# Patient Record
Sex: Male | Born: 1955 | Race: White | Hispanic: No | Marital: Married | Smoking: Former smoker
Health system: Southern US, Community
[De-identification: ages and names within clinical notes are randomized; demographics above are authoritative.]

## PROBLEM LIST (undated history)

## (undated) DIAGNOSIS — E8881 Metabolic syndrome: Secondary | ICD-10-CM

## (undated) DIAGNOSIS — E119 Type 2 diabetes mellitus without complications: Secondary | ICD-10-CM

## (undated) DIAGNOSIS — I4891 Unspecified atrial fibrillation: Secondary | ICD-10-CM

## (undated) DIAGNOSIS — N200 Calculus of kidney: Secondary | ICD-10-CM

## (undated) DIAGNOSIS — I10 Essential (primary) hypertension: Secondary | ICD-10-CM

## (undated) DIAGNOSIS — I2510 Atherosclerotic heart disease of native coronary artery without angina pectoris: Secondary | ICD-10-CM

## (undated) DIAGNOSIS — Z8679 Personal history of other diseases of the circulatory system: Secondary | ICD-10-CM

## (undated) DIAGNOSIS — I9789 Other postprocedural complications and disorders of the circulatory system, not elsewhere classified: Secondary | ICD-10-CM

## (undated) DIAGNOSIS — Z951 Presence of aortocoronary bypass graft: Secondary | ICD-10-CM

## (undated) DIAGNOSIS — E785 Hyperlipidemia, unspecified: Secondary | ICD-10-CM

## (undated) HISTORY — DX: Other postprocedural complications and disorders of the circulatory system, not elsewhere classified: I97.89

## (undated) HISTORY — DX: Atherosclerotic heart disease of native coronary artery without angina pectoris: I25.10

## (undated) HISTORY — DX: Hyperlipidemia, unspecified: E78.5

## (undated) HISTORY — DX: Metabolic syndrome: E88.810

## (undated) HISTORY — DX: Unspecified atrial fibrillation: I48.91

## (undated) HISTORY — DX: Presence of aortocoronary bypass graft: Z95.1

## (undated) HISTORY — DX: Personal history of other diseases of the circulatory system: Z86.79

## (undated) HISTORY — DX: Calculus of kidney: N20.0

## (undated) HISTORY — DX: Type 2 diabetes mellitus without complications: E11.9

## (undated) HISTORY — DX: Metabolic syndrome: E88.81

---

## 1979-04-09 HISTORY — PX: STONE EXTRACTION WITH BASKET: SHX5318

## 1999-12-07 HISTORY — PX: CARDIOVASCULAR STRESS TEST: SHX262

## 2001-07-30 ENCOUNTER — Encounter: Admission: RE | Admit: 2001-07-30 | Discharge: 2001-07-30 | Payer: Self-pay | Admitting: Cardiology

## 2001-07-30 ENCOUNTER — Encounter: Payer: Self-pay | Admitting: Cardiology

## 2001-08-02 ENCOUNTER — Ambulatory Visit (HOSPITAL_COMMUNITY): Admission: RE | Admit: 2001-08-02 | Discharge: 2001-08-02 | Payer: Self-pay | Admitting: Cardiology

## 2001-08-02 HISTORY — PX: CARDIAC CATHETERIZATION: SHX172

## 2008-09-09 ENCOUNTER — Ambulatory Visit (HOSPITAL_COMMUNITY): Admission: RE | Admit: 2008-09-09 | Discharge: 2008-09-09 | Payer: Self-pay | Admitting: Cardiology

## 2010-11-23 LAB — COMPREHENSIVE METABOLIC PANEL
ALT: 29 U/L (ref 0–53)
Albumin: 3.7 g/dL (ref 3.5–5.2)
Alkaline Phosphatase: 79 U/L (ref 39–117)
Calcium: 8.9 mg/dL (ref 8.4–10.5)
Potassium: 4.1 mEq/L (ref 3.5–5.1)
Sodium: 135 mEq/L (ref 135–145)
Total Protein: 6.8 g/dL (ref 6.0–8.3)

## 2010-11-23 LAB — CBC
HCT: 52.8 % — ABNORMAL HIGH (ref 39.0–52.0)
Hemoglobin: 18.1 g/dL — ABNORMAL HIGH (ref 13.0–17.0)
MCHC: 34.4 g/dL (ref 30.0–36.0)
MCV: 85.1 fL (ref 78.0–100.0)
Platelets: 209 10*3/uL (ref 150–400)
RBC: 6.2 MIL/uL — ABNORMAL HIGH (ref 4.22–5.81)
RDW: 12.7 % (ref 11.5–15.5)
WBC: 9.4 10*3/uL (ref 4.0–10.5)

## 2010-11-23 LAB — URINALYSIS, MICROSCOPIC ONLY
Glucose, UA: >1000 mg/dL — AB
Hgb urine dipstick: NEGATIVE
Ketones, ur: 15 mg/dL — AB
Leukocytes, UA: NEGATIVE
Nitrite: NEGATIVE
Protein, ur: 30 mg/dL — AB
Specific Gravity, Urine: 1.033 — ABNORMAL HIGH (ref 1.005–1.030)
Urobilinogen, UA: 1 mg/dL (ref 0.0–1.0)
pH: 5.5 (ref 5.0–8.0)

## 2010-11-23 LAB — LIPID PANEL
HDL: 20 mg/dL — ABNORMAL LOW (ref >39)
LDL Cholesterol: 86 mg/dL (ref 0–99)
Triglycerides: 250 mg/dL — ABNORMAL HIGH (ref <150)

## 2010-11-23 LAB — HEMOGLOBIN A1C: Mean Plasma Glucose: 258 mg/dL

## 2010-11-23 LAB — PSA: PSA: 0.28 ng/mL (ref 0.10–4.00)

## 2010-12-24 NOTE — Cardiovascular Report (Signed)
[facility]  Patient:    Gregory Reese, Gregory Reese Visit Number: [id number] MRN: [id number]          Service Type: CAT Location: [facility] 2899 11 Attending Physician:  Loreli Dollar Dictated by:   Julieanne Manson, M.D. Admit Date:  08/02/2001                          Cardiac Catheterization  DATE OF BIRTH:  1956-06-30.  INDICATIONS:  [name] is a 55 year old male who is hypertensive.  He presented for a routine physical exam, had T wave inversion in the inferolateral leads which is new from his prior tracing.  He has had GI-type complaints and some different type of indigestion complaints.  Because of this, he is brought in for outpatient cardiac catheterization.  EQUIPMENT:  The 6-French Judkins configuration catheters.  DESCRIPTION OF PROCEDURE:  The patient was prepped and draped in the usual sterile fashion exposing the right groin.  Applying local anesthetic of 1% Xylocaine, the Seldinger technique was employed and a 6-French introducer sheath was placed in the right femoral artery.  Selective left and right coronary arteriography and ventriculography in the RAO projection was performed.  COMPLICATIONS:  None.  RESULTS:  Hemodynamic monitoring:  Central aortic pressure 143/79.  Left ventricular pressure 143/16 with no aortic valve gradient noted at the time of pullback.  Ventriculography:  Ventriculography in the RAO projection revealed normal left ventricular systolic function.  Ejection fraction was calculated at 76% but there was ventricular ectopy.  It was clearly greater than 60% with no wall motion abnormalities.  End diastolic pressure was 16.  Coronary arteriography:  No calcification seen on fluoroscopy. 1. Left main:  Normal. 2. LAD:  The LAD extended down to the apex of the heart and gave rise to a    very large first diagonal branch.  There were minimal irregularities in the    LAD. 3. Circumflex:  The circumflex gave  rise to one large OM-1 and two small    distal OMs.  This system was free of significant disease. 4. Right coronary artery:  The right coronary artery is a dominant vessel free    of disease. 5. Optional diagonal:  Minimal 40-50% ostial narrowing.  CONCLUSIONS: 1. Normal left ventricular systolic function. 2. Mild disease in the ostium of the optional diagonal.  No significant    disease in the left anterior descending artery, circumflex, or right    coronary artery.  At this point, the patient will be treated medically and will be discharged later today. Dictated by:   Julieanne Manson, M.D. Attending Physician:  Loreli Dollar DD:  08/02/01 TD:  08/02/01 Job: [id number]

## 2012-03-08 DIAGNOSIS — Z8679 Personal history of other diseases of the circulatory system: Secondary | ICD-10-CM

## 2012-03-08 DIAGNOSIS — I2510 Atherosclerotic heart disease of native coronary artery without angina pectoris: Secondary | ICD-10-CM

## 2012-03-08 HISTORY — DX: Atherosclerotic heart disease of native coronary artery without angina pectoris: I25.10

## 2012-03-08 HISTORY — DX: Personal history of other diseases of the circulatory system: Z86.79

## 2012-04-02 ENCOUNTER — Other Ambulatory Visit: Payer: Self-pay | Admitting: Cardiology

## 2012-04-03 ENCOUNTER — Other Ambulatory Visit: Payer: Self-pay | Admitting: Cardiology

## 2012-04-04 ENCOUNTER — Inpatient Hospital Stay (HOSPITAL_COMMUNITY)
Admission: RE | Admit: 2012-04-04 | Discharge: 2012-04-12 | DRG: 107 | Disposition: A | Discharge status: Home / Self Care | Payer: BC Managed Care – PPO | Source: Ambulatory Visit | Attending: Surgery | Admitting: Surgery

## 2012-04-04 ENCOUNTER — Encounter (HOSPITAL_COMMUNITY): Payer: Self-pay | Admitting: General Practice

## 2012-04-04 ENCOUNTER — Encounter (HOSPITAL_COMMUNITY)
Admission: RE | Disposition: A | Discharge status: Home / Self Care | Payer: Self-pay | Source: Ambulatory Visit | Attending: Surgery

## 2012-04-04 ENCOUNTER — Other Ambulatory Visit: Payer: Self-pay | Admitting: *Deleted

## 2012-04-04 DIAGNOSIS — Y838 Other surgical procedures as the cause of abnormal reaction of the patient, or of later complication, without mention of misadventure at the time of the procedure: Secondary | ICD-10-CM | POA: Diagnosis not present

## 2012-04-04 DIAGNOSIS — Z87891 Personal history of nicotine dependence: Secondary | ICD-10-CM

## 2012-04-04 DIAGNOSIS — K219 Gastro-esophageal reflux disease without esophagitis: Secondary | ICD-10-CM | POA: Diagnosis present

## 2012-04-04 DIAGNOSIS — Z9119 Patient's noncompliance with other medical treatment and regimen: Secondary | ICD-10-CM

## 2012-04-04 DIAGNOSIS — K5900 Constipation, unspecified: Secondary | ICD-10-CM | POA: Diagnosis present

## 2012-04-04 DIAGNOSIS — I519 Heart disease, unspecified: Secondary | ICD-10-CM | POA: Diagnosis not present

## 2012-04-04 DIAGNOSIS — I10 Essential (primary) hypertension: Secondary | ICD-10-CM | POA: Diagnosis present

## 2012-04-04 DIAGNOSIS — I4891 Unspecified atrial fibrillation: Secondary | ICD-10-CM | POA: Diagnosis present

## 2012-04-04 DIAGNOSIS — I2510 Atherosclerotic heart disease of native coronary artery without angina pectoris: Secondary | ICD-10-CM

## 2012-04-04 DIAGNOSIS — D62 Acute posthemorrhagic anemia: Secondary | ICD-10-CM | POA: Diagnosis not present

## 2012-04-04 DIAGNOSIS — I480 Paroxysmal atrial fibrillation: Secondary | ICD-10-CM | POA: Diagnosis not present

## 2012-04-04 DIAGNOSIS — Z7982 Long term (current) use of aspirin: Secondary | ICD-10-CM

## 2012-04-04 DIAGNOSIS — E119 Type 2 diabetes mellitus without complications: Secondary | ICD-10-CM | POA: Diagnosis present

## 2012-04-04 DIAGNOSIS — Z8249 Family history of ischemic heart disease and other diseases of the circulatory system: Secondary | ICD-10-CM

## 2012-04-04 DIAGNOSIS — E8779 Other fluid overload: Secondary | ICD-10-CM | POA: Diagnosis present

## 2012-04-04 DIAGNOSIS — R1310 Dysphagia, unspecified: Secondary | ICD-10-CM | POA: Diagnosis present

## 2012-04-04 DIAGNOSIS — I200 Unstable angina: Secondary | ICD-10-CM | POA: Diagnosis present

## 2012-04-04 DIAGNOSIS — E669 Obesity, unspecified: Secondary | ICD-10-CM | POA: Diagnosis present

## 2012-04-04 DIAGNOSIS — Z91199 Patient's noncompliance with other medical treatment and regimen due to unspecified reason: Secondary | ICD-10-CM

## 2012-04-04 DIAGNOSIS — Z79899 Other long term (current) drug therapy: Secondary | ICD-10-CM

## 2012-04-04 DIAGNOSIS — E1169 Type 2 diabetes mellitus with other specified complication: Secondary | ICD-10-CM | POA: Diagnosis present

## 2012-04-04 HISTORY — PX: LEFT HEART CATHETERIZATION WITH CORONARY ANGIOGRAM: SHX5451

## 2012-04-04 HISTORY — PX: CARDIAC CATHETERIZATION: SHX172

## 2012-04-04 HISTORY — DX: Essential (primary) hypertension: I10

## 2012-04-04 LAB — CBC
HCT: 41.9 % (ref 39.0–52.0)
Platelets: 203 10*3/uL (ref 150–400)
RBC: 5.1 MIL/uL (ref 4.22–5.81)
RDW: 13.1 % (ref 11.5–15.5)
WBC: 6 10*3/uL (ref 4.0–10.5)

## 2012-04-04 LAB — GLUCOSE, CAPILLARY

## 2012-04-04 LAB — LIPID PANEL
Cholesterol: 147 mg/dL (ref 0–200)
HDL: 20 mg/dL — ABNORMAL LOW (ref >39)
Total CHOL/HDL Ratio: 7.4 RATIO
Triglycerides: 285 mg/dL — ABNORMAL HIGH (ref <150)
VLDL: 57 mg/dL — ABNORMAL HIGH (ref 0–40)

## 2012-04-04 LAB — BASIC METABOLIC PANEL
Calcium: 8.9 mg/dL (ref 8.4–10.5)
Chloride: 104 mEq/L (ref 96–112)
Creatinine, Ser: 0.85 mg/dL (ref 0.50–1.35)
GFR calc Af Amer: >90 mL/min (ref >90)
Sodium: 138 mEq/L (ref 135–145)

## 2012-04-04 LAB — HEPATIC FUNCTION PANEL
Albumin: 3.7 g/dL (ref 3.5–5.2)
Alkaline Phosphatase: 54 U/L (ref 39–117)
Total Protein: 6.8 g/dL (ref 6.0–8.3)

## 2012-04-04 LAB — PROTIME-INR: INR: 1 (ref 0.00–1.49)

## 2012-04-04 LAB — HEMOGLOBIN A1C: Mean Plasma Glucose: 194 mg/dL — ABNORMAL HIGH (ref <117)

## 2012-04-04 SURGERY — LEFT HEART CATHETERIZATION WITH CORONARY ANGIOGRAM
Anesthesia: LOCAL

## 2012-04-04 MED ORDER — ASPIRIN EC 81 MG PO TBEC
81.0000 mg | DELAYED_RELEASE_TABLET | Freq: Every day | ORAL | Status: DC
  Administered 2012-04-05: 10:00:00 81 mg via ORAL
  Filled 2012-04-04 (×2): qty 1

## 2012-04-04 MED ORDER — ASPIRIN 81 MG PO TABS: 81.0000 mg | ORAL_TABLET | Freq: Every day | ORAL | Status: DC

## 2012-04-04 MED ORDER — ASPIRIN 81 MG PO CHEW
324.0000 mg | CHEWABLE_TABLET | ORAL | Status: AC
  Administered 2012-04-04: 324 mg via ORAL
  Filled 2012-04-04: qty 4

## 2012-04-04 MED ORDER — VERAPAMIL HCL 2.5 MG/ML IV SOLN
INTRAVENOUS | Status: AC
  Filled 2012-04-04: qty 2

## 2012-04-04 MED ORDER — SODIUM CHLORIDE 0.9 % IJ SOLN: 3.0000 mL | INTRAMUSCULAR | Status: DC | PRN

## 2012-04-04 MED ORDER — SODIUM CHLORIDE 0.9 % IJ SOLN
3.0000 mL | Freq: Two times a day (BID) | INTRAMUSCULAR | Status: DC
  Administered 2012-04-04 – 2012-04-05 (×3): 3 mL via INTRAVENOUS

## 2012-04-04 MED ORDER — SODIUM CHLORIDE 0.9 % IV SOLN: 250.0000 mL | INTRAVENOUS | Status: DC | PRN

## 2012-04-04 MED ORDER — DIAZEPAM 5 MG PO TABS
5.0000 mg | ORAL_TABLET | ORAL | Status: AC
  Administered 2012-04-04: 5 mg via ORAL
  Filled 2012-04-04: qty 1

## 2012-04-04 MED ORDER — NITROGLYCERIN 0.2 MG/ML ON CALL CATH LAB
INTRAVENOUS | Status: AC
  Filled 2012-04-04: qty 1

## 2012-04-04 MED ORDER — SODIUM CHLORIDE 0.9 % IV SOLN
INTRAVENOUS | Status: DC
  Administered 2012-04-04: 09:00:00 via INTRAVENOUS

## 2012-04-04 MED ORDER — HEPARIN (PORCINE) IN NACL 2-0.9 UNIT/ML-% IJ SOLN
INTRAMUSCULAR | Status: AC
  Filled 2012-04-04: qty 2000

## 2012-04-04 MED ORDER — ACETAMINOPHEN 325 MG PO TABS: 650.0000 mg | ORAL_TABLET | ORAL | Status: DC | PRN

## 2012-04-04 MED ORDER — LIDOCAINE HCL (PF) 1 % IJ SOLN
INTRAMUSCULAR | Status: AC
  Filled 2012-04-04: qty 30

## 2012-04-04 MED ORDER — FENTANYL CITRATE 0.05 MG/ML IJ SOLN
INTRAMUSCULAR | Status: AC
  Filled 2012-04-04: qty 2

## 2012-04-04 MED ORDER — HEPARIN SODIUM (PORCINE) 1000 UNIT/ML IJ SOLN
INTRAMUSCULAR | Status: AC
  Filled 2012-04-04: qty 1

## 2012-04-04 MED ORDER — ONDANSETRON HCL 4 MG/2ML IJ SOLN: 4.0000 mg | Freq: Four times a day (QID) | INTRAMUSCULAR | Status: DC | PRN

## 2012-04-04 MED ORDER — ATORVASTATIN CALCIUM 80 MG PO TABS
80.0000 mg | ORAL_TABLET | Freq: Every day | ORAL | Status: DC
  Administered 2012-04-04 – 2012-04-05 (×2): 80 mg via ORAL
  Filled 2012-04-04 (×3): qty 1

## 2012-04-04 MED ORDER — LISINOPRIL 40 MG PO TABS
40.0000 mg | ORAL_TABLET | Freq: Every day | ORAL | Status: DC
  Administered 2012-04-05: 40 mg via ORAL
  Filled 2012-04-04 (×3): qty 1

## 2012-04-04 MED ORDER — SODIUM CHLORIDE 0.9 % IV SOLN
INTRAVENOUS | Status: DC
  Administered 2012-04-04: 21:00:00 via INTRAVENOUS

## 2012-04-04 MED ORDER — LINAGLIPTIN 5 MG PO TABS
5.0000 mg | ORAL_TABLET | Freq: Every day | ORAL | Status: DC
  Administered 2012-04-05: 10:00:00 5 mg via ORAL
  Filled 2012-04-04 (×3): qty 1

## 2012-04-04 MED ORDER — PANTOPRAZOLE SODIUM 40 MG PO TBEC
40.0000 mg | DELAYED_RELEASE_TABLET | Freq: Every day | ORAL | Status: DC
  Administered 2012-04-04 – 2012-04-05 (×2): 40 mg via ORAL
  Filled 2012-04-04 (×2): qty 1

## 2012-04-04 MED ORDER — MORPHINE SULFATE 2 MG/ML IJ SOLN: 2.0000 mg | INTRAMUSCULAR | Status: DC | PRN

## 2012-04-04 MED ORDER — MIDAZOLAM HCL 2 MG/2ML IJ SOLN
INTRAMUSCULAR | Status: AC
  Filled 2012-04-04: qty 2

## 2012-04-04 MED ORDER — OMEPRAZOLE MAGNESIUM 20 MG PO TBEC: 20.0000 mg | DELAYED_RELEASE_TABLET | Freq: Every day | ORAL | Status: DC

## 2012-04-04 NOTE — CV Procedure (Signed)
[facility] PERCUTANEOUS CORONARY INTERVENTION REPORT  NAME:  Gregory Reese   MRN: [id number] DOB:  09/08/1955   ADMIT DATE: 04/04/2012  INTERVENTIONAL CARDIOLOGIST: Marykay Lex, M.D., MS PRIMARY CARE PROVIDER: Stefanie Libel, MD REFERRING CARDIOLOGIST: Julieanne Manson, M.D.   PATIENT:  Gregory Reese is a 56 y.o. male self-referred to see Dr. Caprice Kluver last week after last being seen in 2011. 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 He had no shortness of breath and he had no awareness of any arrhythmias with this. This happened regularly while he was out fishing, several times per day. He has been back in [place] for 3 days, has had no recurrence of any symptoms. Although he hydrated himself with Gatorade and lots of fluids he continued to have leg cramps at night while he was in [place], raising the concern that he was probably  volume depleted.  He has had no awareness of any arrhythmias. He has had no unusual shortness of breath. He denies fevers, chills, or night sweats, and his only real complaint is that he has dysphagia. Sometimes if he eats fast he will get choked and has to go throw up. He has a long history of reflux and he takes omeprazole intermittently for this.  He was asked to see his PMD to evaluate atypical / non-anginal chest pains -- likely GI in nature & was found to have an abnormal ECG with changes concerning for Anterior ischemia - while having his pain. Biphasic TW in V2 & TWI in V3-V5.  PRE-OPERATIVE DIAGNOSIS:    Unstable angina: Progressively worsening exertional followed by resting chest discomfort with dynamic EKG changes.  Diabetes Mellitus Type 2 with marginal compliance.  PROCEDURES PERFORMED:    Left Heart  Catheterization with native Coronary Angiography Via 5 Fr Right Radial Artery access  Left Ventriculography 11 mL contrast/sec for 3 seconds in the RAO projection  PROCEDURE: Consent:  Risks of procedure as well as the alternatives and risks of each were explained to the (patient/caregiver).  Consent for procedure obtained.  Dull discussion documented in chart. Consent for signed by MD and patient with RN witness -- placed on chart.  PROCEDURE: The patient was brought to the 2nd Floor [facility] in the fasting state and prepped and draped in the usual sterile fashion for Right radial access. Allen's test with plethysmography demonstrated excellent Ulnar Artery collateral flow, except for Radial Artery access. Sterile technique was used including antiseptics, cap, gloves, gown, hand hygiene, mask and sheet. Skin prep: Chlorhexidine;  Time Out: Verified patient identification, verified procedure, site/side was marked, verified correct patient position, special equipment/implants available, medications/allergies/relevent history reviewed, required imaging and test results available.  Performed  Access: Right Radial Artery; 5 Fr Glide Sheath; Seldinger technique using glide sheath micropuncture kit.  Diagnostic:  TIG 4.0 advanced over Versicore wire, exchanged for  angled pigtail catheter  Left  and Right Coronary Artery Angiography: 5 Fr TIG 4.0 catheter  LV Hemodynamics (LV Gram): 5 Fr Angled Pigtail catheter  Hemodynamics:  Central Aortic / Mean Pressures:  133/73 mmHg;  96 mmHg  Left Ventricular Pressures / EDP:  128/15 mmHg;  19 mmHg  Left Ventriculography:  EF:  55-60 %  Wall Motion:  Essentially normal with perhaps a  mild mid anterolateral hypokinesis.  Coronary Anatomy:  Left Main:  Large caliber vessel bifurcates into LAD and Circumflex, angiographically normal LAD:  Caliber vessel gives rise to a proximal first diagonal (D1) and septal perforator  followed by a tapering lesion going from 50 down to 80% stenosis most notable at the bifurcation of the second diagonal (D2) then tapered back up to normal vessel to rub the remainder of the vessel which wraps around the apex  D1:  Ostial hazy 90% stenosis in a small to moderate caliber vessel.  D2:  Moderate caliber major diagonal branch with a long ostial proximal 70-80% lesion the severity of which is noted to test in the LAO caudal projection. Left Circumflex:  Large caliber vessel bifurcates into a OM1 and the AV groove circumflex. Minimal luminal irregularities.   OM1:  Moderate caliber lateral marginal with 3 branches distally. Minimal luminal irregularities.  After giving off a lateral OM, the circumflex flex courses in the AV groove to give off 2 left posterior lateral branches.  RCA:  Large caliber dominant vessel, several small caliber right ventricular marginal branches before the vessel bifurcates distally into the RPDA and right posterior lateral system. Minimal luminal irregularities throughout the entire RCA system.  RPDA:  Moderate caliber vessel tapers down toward the apex several septal perforators.  RPL Sysytem:The Right Posterior AV Groove Branch  moderate caliber vessel tapers and a small-caliber vessel with 2 main RPL branches.  TR Band:  1134 Hours, 9 mL air  ANESTHESIA:   Local Lidocaine 2 ml SEDATION:  2 mg IV Versed, 50 mcg IV fentanyl ; Premedication: 5mg  PO Valium MEDICATIONS: Radial Cocktail: 5 mg Verapamil, 400 mcg NTG, 2 ml 2% Lidocaine in 10 ml NS  Anticoagulation: IV Heparin 5000 Units  EBL:   < 5 ml  PATIENT DISPOSITION:    The patient was transferred to the PACU holding area in a hemodynamicaly stable, chest pain free condition.  The patient tolerated the procedure well, and there were no complications.  The patient was stable before, during, and after the procedure.  POST-OPERATIVE DIAGNOSIS:    Severe disease in the LAD system involving the  ostium and proximal portions of 2 diagonal branches in the intervening LAD segment with a 80% stenosis involving the bifurcation of the LAD and D1.  Although this is approachable for percutaneous standpoint, and diabetic patient with borderline compliant arterial conduit bypass surgery with LIMA in either RIMA or radial artery conduit to the diagonal branches would be his best option.    Well-preserved LV EF with normal LVEDP  Although the smallest of the 3 vessels involved, the D1 lesion appears to be the culprit lesion for his resting angina as it is hazy in appearance with the most severe stenosis.  PLAN OF CARE:  Admit for overnight observation in order to allow for CT surgery see the patient -- Dr. Clarene Duke has contacted Dr. Laneta Simmers from CT surgery. Reluctant to discharge the patient until plan is that as he has had resting angina now with dynamic changes.  Continue ACE inhibitor as well as aspirin and will add statin.  Oral hypoglycemics for now, holding metformin.   Marykay Lex, M.D., M.S. [facility] [address] [place], [place]  [address]  [phone]  04/04/2012 2:07 PM

## 2012-04-04 NOTE — Consult Note (Signed)
[address]            [name] [address]          [phone]      Reason for Consult: Severe single-vessel coronary disease with unstable angina Referring Physician: Dr. Julieanne Manson  Gregory Reese is an 56 y.o. male.  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 Over the past month or so he has been having exertional fatigue as well as numbness down the inner aspect of both arms with generalized weakness in his upper extremities. He was recently fishing in [place] in high temperatures and had repeated episodes of this. Over the past week he has had recurrent episodes at rest sometimes waking him up at night and this is been associated with substernal chest discomfort as well as some tightness in his epigastrium. An ECG showed changes concerning for anterior ischemia. Cardiac catheterization today showed severe single-vessel coronary disease. The LAD had a 50-80% proximal stenosis at the bifurcation of the second diagonal branch which a large vessel. The first diagonal is a small to medium-size vessel that has a hazy ostial 90% stenosis. The second diagonal has a long ostial and proximal 70-80% stenosis. The left circumflex has no significant disease. The right coronary artery is a dominant vessel that has no significant disease. Left ventricular ejection fraction is 55-60% with mild anterolateral hypokinesis. The catheterization was done through his right radial artery.  Past Medical History  Diagnosis Date  . Kidney stones   . Hypertension   . Type II diabetes mellitus     Past Surgical History  Procedure Date  . Cardiac catheterization 04/04/2012  . Stone extraction with basket [id number]    History reviewed. No  pertinent family history.  Social History:  reports that he quit smoking about 2 years ago. His smoking use included Cigars. His smokeless tobacco use includes Chew. He reports that he does not drink alcohol or use illicit drugs.  Allergies:  Allergies  Allergen Reactions  . Penicillins Other (See Comments)    "I get dizzy and pass out; pretty close to it" "ALL THE CILLINS"    Medications:  I have reviewed the patient's current medications. Prior to Admission:  Prescriptions prior to admission  Medication Sig Dispense Refill  . aspirin 81 MG tablet Take 81 mg by mouth daily.      Marland Kitchen linagliptin (TRADJENTA) 5 MG TABS tablet Take 5 mg by mouth daily.      Marland Kitchen lisinopril (PRINIVIL,ZESTRIL) 40 MG tablet Take 40 mg by mouth daily.      . metFORMIN (GLUCOPHAGE) 500 MG tablet Take 500 mg by mouth 2 (two) times daily with a meal.      . omeprazole (PRILOSEC OTC) 20 MG tablet Take 20 mg by mouth daily.       Scheduled:   . aspirin  324 mg Oral Pre-Cath  . aspirin EC  81 mg Oral Daily  . atorvastatin  80 mg Oral q1800  . diazepam  5 mg Oral On Call  . fentaNYL      . heparin      . heparin      .  lidocaine      . linagliptin  5 mg Oral Q breakfast  . lisinopril  40 mg Oral Daily  . midazolam      . nitroGLYCERIN      . pantoprazole  40 mg Oral Q1200  . sodium chloride  3 mL Intravenous Q12H  . verapamil      . DISCONTD: aspirin  81 mg Oral Daily  . DISCONTD: omeprazole  20 mg Oral Daily   Continuous:   . sodium chloride 75 mL/hr at 04/04/12 1330  . DISCONTD: sodium chloride 75 mL/hr at 04/04/12 0842   UJW:JXBJYN chloride, acetaminophen, morphine injection, ondansetron (ZOFRAN) IV, sodium chloride, DISCONTD: sodium chloride  Results for orders placed during the hospital encounter of 04/04/12 (from the past 48 hour(s))  GLUCOSE, CAPILLARY     Status: Abnormal   Collection Time   04/04/12  8:18 AM      Component Value Range Comment   Glucose-Capillary 219 (*) 70 - 99 mg/dL     Comment 1 Documented in Chart      Comment 2 Notify RN     HEMOGLOBIN A1C     Status: Abnormal   Collection Time   04/04/12  8:27 AM      Component Value Range Comment   Hemoglobin A1C 8.4 (*) <5.7 %    Mean Plasma Glucose 194 (*) <117 mg/dL   HEPATIC FUNCTION PANEL     Status: Normal   Collection Time   04/04/12  8:27 AM      Component Value Range Comment   Total Protein 6.8  6.0 - 8.3 g/dL    Albumin 3.7  3.5 - 5.2 g/dL    AST 16  0 - 37 U/L    ALT 24  0 - 53 U/L    Alkaline Phosphatase 54  39 - 117 U/L    Total Bilirubin 0.4  0.3 - 1.2 mg/dL    Bilirubin, Direct <8.2  0.0 - 0.3 mg/dL    Indirect Bilirubin NOT CALCULATED  0.3 - 0.9 mg/dL   LIPID PANEL     Status: Abnormal   Collection Time   04/04/12  8:27 AM      Component Value Range Comment   Cholesterol 147  0 - 200 mg/dL    Triglycerides 956 (*) <150 mg/dL    HDL 20 (*) >21 mg/dL    Total CHOL/HDL Ratio 7.4      VLDL 57 (*) 0 - 40 mg/dL    LDL Cholesterol 70  0 - 99 mg/dL   BASIC METABOLIC PANEL     Status: Abnormal   Collection Time   04/04/12  8:27 AM      Component Value Range Comment   Sodium 138  135 - 145 mEq/L    Potassium 3.8  3.5 - 5.1 mEq/L    Chloride 104  96 - 112 mEq/L    CO2 21  19 - 32 mEq/L    Glucose, Bld 219 (*) 70 - 99 mg/dL    BUN 28 (*) 6 - 23 mg/dL    Creatinine, Ser 3.08  0.50 - 1.35 mg/dL    Calcium 8.9  8.4 - 65.7 mg/dL    GFR calc non Af Amer >90  >90 mL/min    GFR calc Af Amer >90  >90 mL/min   CBC     Status: Normal   Collection Time   04/04/12  8:27 AM      Component Value Range Comment   WBC  6.0  4.0 - 10.5 K/uL    RBC 5.10  4.22 - 5.81 MIL/uL    Hemoglobin 14.6  13.0 - 17.0 g/dL    HCT 78.2  95.6 - 21.3 %    MCV 82.2  78.0 - 100.0 fL    MCH 28.6  26.0 - 34.0 pg    MCHC 34.8  30.0 - 36.0 g/dL    RDW 08.6  57.8 - 46.9 %    Platelets 203  150 - 400 K/uL   PROTIME-INR     Status: Normal   Collection Time   04/04/12  8:27 AM      Component Value Range Comment   Prothrombin Time  13.4  11.6 - 15.2 seconds    INR 1.00  0.00 - 1.49   GLUCOSE, CAPILLARY     Status: Abnormal   Collection Time   04/04/12 12:22 PM      Component Value Range Comment   Glucose-Capillary 165 (*) 70 - 99 mg/dL   GLUCOSE, CAPILLARY     Status: Abnormal   Collection Time   04/04/12  5:52 PM      Component Value Range Comment   Glucose-Capillary 140 (*) 70 - 99 mg/dL     No results found.  Review of Systems  Constitutional: Positive for malaise/fatigue and diaphoresis. Negative for fever, chills and weight loss.  HENT: Negative.   Eyes: Negative.   Respiratory: Negative.   Cardiovascular: Positive for chest pain. Negative for palpitations, orthopnea, leg swelling and PND.  Gastrointestinal: Negative.   Genitourinary: Negative.   Musculoskeletal: Negative.   Skin: Negative.   Neurological: Negative.   Endo/Heme/Allergies: Negative.   Psychiatric/Behavioral: Negative.    Blood pressure 131/55, pulse 58, temperature 97.7 F (36.5 C), temperature source Oral, resp. rate 16, height 6\' 1"  (1.854 m), weight 109.77 kg (242 lb), SpO2 99.00%. Physical Exam  Constitutional: He is oriented to person, place, and time. He appears well-developed and well-nourished. No distress.  HENT:  Head: Normocephalic and atraumatic.  Mouth/Throat: Oropharynx is clear and moist.  Eyes: Conjunctivae and EOM are normal. Pupils are equal, round, and reactive to light.  Neck: Normal range of motion. Neck supple. No JVD present. No tracheal deviation present. No thyromegaly present.  Cardiovascular: Normal rate, regular rhythm, normal heart sounds and intact distal pulses.  Exam reveals no gallop and no friction rub.   No murmur heard.      Strong left radial pulse.  Respiratory: Effort normal and breath sounds normal. No respiratory distress.  GI: Soft. Bowel sounds are normal. He exhibits no distension and no mass. There is no tenderness.  Musculoskeletal: Normal range of motion. He exhibits no edema.    Lymphadenopathy:    He has no cervical adenopathy.  Neurological: He is alert and oriented to person, place, and time. He has normal strength. No cranial nerve deficit or sensory deficit.  Skin: Skin is warm and dry.  Psychiatric: He has a normal mood and affect.   Cardiac Cath:  Hemodynamics:  Central Aortic / Mean Pressures:  133/73 mmHg;  96 mmHg   Left Ventricular Pressures / EDP:  128/15 mmHg;  19 mmHg  Left Ventriculography:  EF:  55-60 %   Wall Motion:  Essentially normal with perhaps a mild mid anterolateral hypokinesis.  Coronary Anatomy:  Left Main:  Large caliber vessel bifurcates into LAD and Circumflex, angiographically normal LAD:  Caliber vessel gives rise to a proximal first diagonal (D1) and septal perforator followed by a tapering lesion  going from 50 down to 80% stenosis most notable at the bifurcation of the second diagonal (D2) then tapered back up to normal vessel to rub the remainder of the vessel which wraps around the apex  D1:  Ostial hazy 90% stenosis in a small to moderate caliber vessel.   D2: Moderate caliber major diagonal branch with a long ostial proximal 70-80% lesion the severity of which is noted to test in the LAO caudal projection. Left Circumflex:  Large caliber vessel bifurcates into a OM1 and the AV groove circumflex. Minimal luminal irregularities.    OM1:  Moderate caliber lateral marginal with 3 branches distally. Minimal luminal irregularities.   After giving off a lateral OM, the circumflex flex courses in the AV groove to give off 2 left posterior lateral branches.  RCA:  Large caliber dominant vessel, several small caliber right ventricular marginal branches before the vessel bifurcates distally into the RPDA and right posterior lateral system. Minimal luminal irregularities throughout the entire RCA system.   RPDA: Moderate caliber vessel tapers down toward the apex several septal perforators.   RPL Sysytem:The Right Posterior  AV Groove Branch  moderate caliber vessel tapers and a small-caliber vessel with 2 main RPL branches.  Assessment/Plan:  He has high-grade stenosis involving the LAD as well as a small to medium size first diagonal and  large second diagonal branch. Given the location of his stenoses and his diabetes I agree that coronary bypass graft surgery is the best treatment to prevent further ischemia and infarction and improve his quality of life. He is 56 years old with single-vessel disease and therefore we will try to use all arterial conduits. If his left internal mammary artery is satisfactory it can be used as a sequential graft for the diagonal and LAD. We will do arterial Dopplers of his upper extremities to decide if we can use his left radial artery to graft the first diagonal vessel or possibly both diagonal vessels. Since he has been having chest pain at rest I would plan do his surgery on Friday morning. I discussed the operative procedure with the patient and family including alternatives, benefits and risks; including but not limited to bleeding, blood transfusion, infection, stroke, myocardial infarction, graft failure, heart block requiring a permanent pacemaker, organ dysfunction, and death.  Maura Crandall understands and agrees to proceed.  We will schedule surgery for Friday morning.  Alleen Borne 04/04/2012, 7:23 PM

## 2012-04-04 NOTE — Brief Op Note (Signed)
04/04/2012  11:56 AM  PATIENT:  Gregory Reese  56 y.o. male seen by Dr. Clarene Duke last week for atypical chest pain symptoms more c/w GERD, who went to his PMD this week while having pain & ECG noted biphasic ST segment in V2 with TWI in V2-V5 that was not present when seen by Dr. Clarene Duke.  Given rest symptoms with dynamic changes, Dr. Clarene Duke (& Dr. Lillie Columbia) felt that ina  Diabetic with HTN & obesity, this was worrisome for unstable angina.  He was referred for cardiac catheterization.  PRE-OPERATIVE DIAGNOSIS:  Chest pain at rest -- with dynamic ECG changes (anterior ST-T wave inversions) -- Unstable Angina.  POST-OPERATIVE DIAGNOSIS:   Severe double bifurcation LAD disease -- tapering 50-80-60% prox-mid (pre D1-post D2) LAD lesion with hazy filling defect ~80-90% ostial D1 (moderate caliber vessel) and long tubular ~70-80% ostial-proximal D2 (more prominent vessel)  Dominant RCA & LCx with no notable CAD  Preserved LVEF ~60% with normal LVEDP ~20mmHg  PROCEDURE:  Procedure(s) (LRB): LEFT HEART CATHETERIZATION WITH CORONARY ANGIOGRAM (N/A) - 5Fr R Radial Access  SURGEON:  Surgeon(s) and Role:    * Marykay Lex, MD - Primary  ANESTHESIA:   local and IV sedation; 2mg  Versed, 50 mcg Fentanyl  EBL:   <5 ml  LOCAL MEDICATIONS USED:  LIDOCAINE 2ml  DEVICES:  5 Fr R Radial Access with 5Fr Glide sheath (Seldinger); TIG 4.0 for LCA & RCA Angiography; Angled Pigtail for LV Hemodynamics & LV Gram.  TR Band:  9 ml Air, 1134 hrs  DICTATION: .Note written in paper chart and Note written in EPIC  PLAN OF CARE: Admit for overnight observation  CVTS Consultation for Severe LAD-D1-D2 double bifurcation disease in diabetic patient.  ASA, ACE & add Statin -- no BB due to bradycardia.  PATIENT DISPOSITION:  PACU - hemodynamically stable.   Delay start of Pharmacological VTE agent (>24hrs) due to surgical blood loss or risk of bleeding: not applicable

## 2012-04-04 NOTE — H&P (Addendum)
History and Physical Interval Note:  NAME:  Gregory Reese   MRN: [id number] DOB:  14-Nov-1955   ADMIT DATE: 04/04/2012   04/04/2012 9:28 AM  Gregory Reese is a 56 y.o. male  is self-referred to see Dr. Caprice Kluver last week after last being seen in 2011. He is a Psychologist, prison and probation services, and while fishing in [place], [place] for 10 straight days at 110-degree temperatures he began having numbness down the inner aspects of both of his arms associated with some weakness in the arms and hands and leg cramping. He had no shortness of breath and he had no awareness of any arrhythmias with this. This happened regularly while he was out fishing, several times per day. He has been back in [place] for 3 days, has had no recurrence of any symptoms. Although he hydrated himself with Gatorade and lots of fluids he continued to have leg cramps at night while he was in [place], raising the concern that he was probably volume depleted. He has had no awareness of any arrhythmias. He has had no unusual shortness of breath. He denies fevers, chills, or night sweats, and his only real complaint is that he has dysphagia. Sometimes if he eats fast he will get choked and has to go throw up. He has a long history of reflux and he takes omeprazole intermittently for this.  He was asked to see his PMD to evaluate atypical / non-anginal chest pains -- likely GI in nature & was found to have an abnormal ECG with changes concerning for Anterior ischemia - while having his pain.  Biphasic TW in V2 & TWI in V3-V5.  PMH:  He had a remote cardiac catheterization around 12 years ago that showed no occlusive coronary disease.   He is diabetic. He has not checked his blood sugar regularly. At one time his hemoglobin A1c was greater than 10. He is now on medical therapy through Dr. Lillie Columbia.   He has a history of hypertension, has been on lisinopril but has actually been out of the lisinopril about 5 days.    ALLERGIES:  PENICILLIN. CURRENT MEDICATIONS: 1. Lisinopril 40.  2. Metformin 500 b.i.d.  3. Aspirin 81.  4. Omeprazole 20 mg every other day.   SOCIAL HISTORY: Married, has 2 children, lives with his wife. No alcohol. He chews tobacco.    PHYSICAL EXAM:General appearance: alert, cooperative, appears stated age and no distress Neck: no adenopathy, no carotid bruit, no JVD, supple, symmetrical, trachea midline and thyroid not enlarged, symmetric, no tenderness/mass/nodules Lungs: clear to auscultation bilaterally, normal percussion bilaterally and non-labored Heart: regular rate and rhythm, S1, S2 normal, no murmur, click, rub or gallop Abdomen: soft, non-tender; bowel sounds normal; no masses,  no organomegaly Extremities: extremities normal, atraumatic, no cyanosis or edema Pulses: 2+ and symmetric Neurologic: Grossly normal CN II-XII grossly intact. There were no vitals taken for this visit.    IMPRESSION & PLAN The patients' history has been reviewed, patient examined, no change in status from most recent note, stable for surgery. I have reviewed the patients' chart and labs. Questions were answered to the patient's satisfaction.    Gregory Reese has presented today for heart catheterization, with the diagnosis of chest pain and abnormal ECG. The various methods of treatment have been discussed with the patient and family.   Risks / Complications include, but not limited to: Death, MI, CVA/TIA, VF/VT (with defibrillation), Bradycardia (need for temporary pacer placement), contrast induced nephropathy, bleeding / bruising /  hematoma / pseudoaneurysm, vascular or coronary injury (with possible emergent CT or Vascular Surgery), adverse medication reactions, infection.     After consideration of risks, benefits and other options for treatment, the patient has consented to Procedure(s):  LEFT HEART CATHETERIZATION AND CORONARY ANGIOGRAPHY +/- AD HOC PERCUTANEOUS CORONARY INTERVENTION  as a  surgical intervention.   We will proceed with the planned procedure.   HARDING,Makenzie W [facility] [address] [place], [place]  [address]  [phone]  04/04/2012 9:28 AM

## 2012-04-05 ENCOUNTER — Ambulatory Visit (HOSPITAL_COMMUNITY): Payer: BC Managed Care – PPO

## 2012-04-05 DIAGNOSIS — I2510 Atherosclerotic heart disease of native coronary artery without angina pectoris: Secondary | ICD-10-CM

## 2012-04-05 DIAGNOSIS — Z01810 Encounter for preprocedural cardiovascular examination: Secondary | ICD-10-CM

## 2012-04-05 LAB — URINE MICROSCOPIC-ADD ON

## 2012-04-05 LAB — GLUCOSE, CAPILLARY
Glucose-Capillary: 149 mg/dL — ABNORMAL HIGH (ref 70–99)
Glucose-Capillary: 197 mg/dL — ABNORMAL HIGH (ref 70–99)

## 2012-04-05 LAB — PULMONARY FUNCTION TEST

## 2012-04-05 LAB — URINALYSIS, ROUTINE W REFLEX MICROSCOPIC
Bilirubin Urine: NEGATIVE
Hgb urine dipstick: NEGATIVE
Specific Gravity, Urine: 1.018 (ref 1.005–1.030)
Urobilinogen, UA: 0.2 mg/dL (ref 0.0–1.0)

## 2012-04-05 LAB — SURGICAL PCR SCREEN: Staphylococcus aureus: NEGATIVE

## 2012-04-05 MED ORDER — METOPROLOL TARTRATE 12.5 MG HALF TABLET
12.5000 mg | ORAL_TABLET | Freq: Once | ORAL | Status: DC
  Filled 2012-04-05 (×2): qty 1

## 2012-04-05 MED ORDER — SODIUM BICARBONATE 8.4 % IV SOLN
INTRAVENOUS | Status: AC
  Administered 2012-04-06: 10:00:00
  Filled 2012-04-05: qty 2.5

## 2012-04-05 MED ORDER — CHLORHEXIDINE GLUCONATE 4 % EX LIQD
60.0000 mL | Freq: Once | CUTANEOUS | Status: AC
  Administered 2012-04-06: 4 via TOPICAL
  Filled 2012-04-05: qty 120

## 2012-04-05 MED ORDER — FENOFIBRATE 160 MG PO TABS
160.0000 mg | ORAL_TABLET | Freq: Every day | ORAL | Status: DC
  Administered 2012-04-05 – 2012-04-12 (×7): 160 mg via ORAL
  Filled 2012-04-05 (×8): qty 1

## 2012-04-05 MED ORDER — TRANEXAMIC ACID (OHS) BOLUS VIA INFUSION
15.0000 mg/kg | INTRAVENOUS | Status: AC
  Administered 2012-04-06: 1121 mg via INTRAVENOUS
  Filled 2012-04-05: qty 1682

## 2012-04-05 MED ORDER — TRANEXAMIC ACID 100 MG/ML IV SOLN
1.5000 mg/kg/h | INTRAVENOUS | Status: AC
  Administered 2012-04-06: 1.5 mg/kg/h via INTRAVENOUS
  Filled 2012-04-05: qty 25

## 2012-04-05 MED ORDER — METOPROLOL TARTRATE 12.5 MG HALF TABLET
12.5000 mg | ORAL_TABLET | Freq: Two times a day (BID) | ORAL | Status: DC
  Administered 2012-04-05 (×2): 12.5 mg via ORAL
  Filled 2012-04-05 (×4): qty 1

## 2012-04-05 MED ORDER — BISACODYL 5 MG PO TBEC
5.0000 mg | DELAYED_RELEASE_TABLET | Freq: Once | ORAL | Status: AC
  Administered 2012-04-05: 5 mg via ORAL
  Filled 2012-04-05: qty 1

## 2012-04-05 MED ORDER — POTASSIUM CHLORIDE 2 MEQ/ML IV SOLN
80.0000 meq | INTRAVENOUS | Status: DC
  Filled 2012-04-05: qty 40

## 2012-04-05 MED ORDER — TRANEXAMIC ACID (OHS) PUMP PRIME SOLUTION
2.0000 mg/kg | INTRAVENOUS | Status: DC
  Filled 2012-04-05: qty 2.24

## 2012-04-05 MED ORDER — DIAZEPAM 5 MG PO TABS
10.0000 mg | ORAL_TABLET | Freq: Once | ORAL | Status: AC
  Administered 2012-04-06: 06:00:00 10 mg via ORAL
  Filled 2012-04-05: qty 2

## 2012-04-05 MED ORDER — SODIUM CHLORIDE 0.9 % IV SOLN
INTRAVENOUS | Status: AC
  Administered 2012-04-06: 3.7 [IU]/h via INTRAVENOUS
  Filled 2012-04-05: qty 1

## 2012-04-05 MED ORDER — TEMAZEPAM 15 MG PO CAPS
15.0000 mg | ORAL_CAPSULE | Freq: Once | ORAL | Status: AC | PRN
  Administered 2012-04-05: 15 mg via ORAL
  Filled 2012-04-05: qty 1

## 2012-04-05 MED ORDER — DEXMEDETOMIDINE HCL IN NACL 400 MCG/100ML IV SOLN
0.1000 ug/kg/h | INTRAVENOUS | Status: AC
  Administered 2012-04-06: 0.3 ug/kg/h via INTRAVENOUS
  Filled 2012-04-05: qty 100

## 2012-04-05 MED ORDER — MOXIFLOXACIN HCL IN NACL 400 MG/250ML IV SOLN
400.0000 mg | INTRAVENOUS | Status: AC
  Administered 2012-04-06: 400 mg via INTRAVENOUS
  Filled 2012-04-05: qty 250

## 2012-04-05 MED ORDER — EPINEPHRINE HCL 1 MG/ML IJ SOLN
0.5000 ug/min | INTRAVENOUS | Status: DC
  Filled 2012-04-05: qty 4

## 2012-04-05 MED ORDER — ALPRAZOLAM 0.25 MG PO TABS: 0.2500 mg | ORAL_TABLET | ORAL | Status: DC | PRN

## 2012-04-05 MED ORDER — DOPAMINE-DEXTROSE 3.2-5 MG/ML-% IV SOLN
2.0000 ug/kg/min | INTRAVENOUS | Status: DC
  Filled 2012-04-05: qty 250

## 2012-04-05 MED ORDER — PHENYLEPHRINE HCL 10 MG/ML IJ SOLN
30.0000 ug/min | INTRAVENOUS | Status: DC
  Administered 2012-04-06: 25 ug/min via INTRAVENOUS
  Filled 2012-04-05: qty 2

## 2012-04-05 MED ORDER — MAGNESIUM SULFATE 50 % IJ SOLN
40.0000 meq | INTRAMUSCULAR | Status: DC
  Filled 2012-04-05: qty 10

## 2012-04-05 MED ORDER — NITROGLYCERIN IN D5W 200-5 MCG/ML-% IV SOLN
2.0000 ug/min | INTRAVENOUS | Status: AC
  Administered 2012-04-06: 5 ug/min via INTRAVENOUS
  Filled 2012-04-05: qty 250

## 2012-04-05 MED ORDER — ~~LOC~~ CARDIAC SURGERY, PATIENT & FAMILY EDUCATION
Freq: Once | Status: DC
  Filled 2012-04-05: qty 1

## 2012-04-05 MED ORDER — VANCOMYCIN HCL 1000 MG IV SOLR
1500.0000 mg | INTRAVENOUS | Status: AC
  Administered 2012-04-06: 1500 mg via INTRAVENOUS
  Filled 2012-04-05: qty 1500

## 2012-04-05 MED ORDER — INSULIN ASPART 100 UNIT/ML ~~LOC~~ SOLN
0.0000 [IU] | Freq: Three times a day (TID) | SUBCUTANEOUS | Status: DC
  Administered 2012-04-05: 2 [IU] via SUBCUTANEOUS

## 2012-04-05 NOTE — Progress Notes (Signed)
Pre-op Cardiac Surgery  Carotid Findings:  No ICA stenosis.  Vertebral artery flow is antegrade.  Upper Extremity Right Left  Brachial Pressures 145T 154T  Radial Waveforms T T  Ulnar Waveforms T T  Palmar Arch (Allen's Test) WNL WNL   Findings:      Lower  Extremity Right Left  Dorsalis Pedis    Anterior Tibial    Posterior Tibial    Ankle/Brachial Indices      Findings:

## 2012-04-05 NOTE — Progress Notes (Signed)
1 Day Post-Op Procedure(s) (LRB): LEFT HEART CATHETERIZATION WITH CORONARY ANGIOGRAM (N/A) Subjective: Had brief chest pain this am  Objective: Vital signs in last 24 hours: Temp:  [97.6 F (36.4 C)-98.4 F (36.9 C)] 97.7 F (36.5 C) (08/29 1500) Pulse Rate:  [63-67] 63  (08/29 1500) Cardiac Rhythm:  [-]  Resp:  [10-16] 13  (08/29 1500) BP: (146-166)/(67-88) 166/76 mmHg (08/29 1500) SpO2:  [98 %-99 %] 99 % (08/29 1500) Weight:  [112.084 kg (247 lb 1.6 oz)] 112.084 kg (247 lb 1.6 oz) (08/29 0500)  Hemodynamic parameters for last 24 hours:    Intake/Output from previous day: 08/28 0701 - 08/29 0700 In: 1837.5 [P.O.:600; I.V.:1237.5] Out: 2025 [Urine:2025] Intake/Output this shift:    General appearance: alert and cooperative Heart: regular rate and rhythm, S1, S2 normal, no murmur, click, rub or gallop Lungs: clear to auscultation bilaterally  Lab Results:  Basename 04/04/12 0827  WBC 6.0  HGB 14.6  HCT 41.9  PLT 203   BMET:  Basename 04/04/12 0827  NA 138  K 3.8  CL 104  CO2 21  GLUCOSE 219*  BUN 28*  CREATININE 0.85  CALCIUM 8.9    PT/INR:  Basename 04/04/12 0827  LABPROT 13.4  INR 1.00   ABG No results found for this basename: phart, pco2, po2, hco3, tco2, acidbasedef, o2sat   CBG (last 3)   Basename 04/05/12 1321 04/05/12 1013 04/04/12 2152  GLUCAP 197* 149* 179*    Assessment/Plan: S/P Procedure(s) (LRB): LEFT HEART CATHETERIZATION WITH CORONARY ANGIOGRAM (N/A) Stable for CABG in am. Upper extremity arterial dopplers normal with normal Allen's test so I will plan to use the left radial artery and LIMA. I discussed the operative procedure with the patient and family including alternatives, benefits and risks; including but not limited to bleeding, blood transfusion, infection, stroke, myocardial infarction, graft failure, heart block requiring a permanent pacemaker, organ dysfunction, and death.  Gregory Reese understands and agrees to proceed.   We will schedule surgery for the am.   LOS: 1 day    Aaima Gaddie K 04/05/2012

## 2012-04-05 NOTE — Progress Notes (Signed)
Inpatient Diabetes Program Recommendations  AACE/ADA: New Consensus Statement on Inpatient Glycemic Control (2013)  Target Ranges:  Prepandial:   less than 140 mg/dL      Peak postprandial:   less than 180 mg/dL (1-2 hours)      Critically ill patients:  140 - 180 mg/dL   Reason for Visit: High fasting glucose  Inpatient Diabetes Program Recommendations Correction (SSI): Pt on oral agent tradjenta.  Would benefit pt to have some correction scale ordered. Start with sensitive tidwc,.  Pt eating 100% Fasting gucose is high. May benefit from 10 units Lantus as well while not taking metformin.  Note: Thank you, Lenor Coffin, RN, CNS, Diabetes Coordinator [phone])

## 2012-04-05 NOTE — Progress Notes (Signed)
[facility]  Subjective: Some burning in his chest this AM which he usually gets in the morning.  Objective: Vital signs in last 24 hours: Temp:  [97.1 F (36.2 C)-98.4 F (36.9 C)] 97.6 F (36.4 C) (08/29 0500) Pulse Rate:  [53-68] 58  (08/28 1430) Resp:  [11-18] 16  (08/29 0500) BP: (129-152)/(55-88) 146/88 mmHg (08/29 0500) SpO2:  [98 %-100 %] 99 % (08/29 0500) Weight:  [109.77 kg (242 lb)-112.084 kg (247 lb 1.6 oz)] 112.084 kg (247 lb 1.6 oz) (08/29 0500) Last BM Date: 04/04/12  Intake/Output from previous day: 08/28 0701 - 08/29 0700 In: 1837.5 [P.O.:600; I.V.:1237.5] Out: 2025 [Urine:2025] Intake/Output this shift:    Medications Current Facility-Administered Medications  Medication Dose Route Frequency Provider Last Rate Last Dose  . 0.9 %  sodium chloride infusion  250 mL Intravenous PRN Marykay Lex, MD      . 0.9 %  sodium chloride infusion   Intravenous Continuous Marykay Lex, MD 75 mL/hr at 04/04/12 2112    . acetaminophen (TYLENOL) tablet 650 mg  650 mg Oral Q4H PRN Marykay Lex, MD      . aspirin chewable tablet 324 mg  324 mg Oral Pre-Cath Marykay Lex, MD   324 mg at 04/04/12 0845  . aspirin EC tablet 81 mg  81 mg Oral Daily Marykay Lex, MD      . atorvastatin (LIPITOR) tablet 80 mg  80 mg Oral q1800 Marykay Lex, MD   80 mg at 04/04/12 1744  . diazepam (VALIUM) tablet 5 mg  5 mg Oral On Call Marykay Lex, MD   5 mg at 04/04/12 0846  . fentaNYL (SUBLIMAZE) 0.05 MG/ML injection           . going for heart surgery book   Does not apply Once Marykay Lex, MD      . heparin 1000 UNIT/ML injection           . heparin 2-0.9 UNIT/ML-% infusion           . lidocaine (XYLOCAINE) 1 % injection           . linagliptin (TRADJENTA) tablet 5 mg  5 mg Oral Q breakfast Marykay Lex, MD      . lisinopril (PRINIVIL,ZESTRIL) tablet 40 mg  40 mg Oral Daily Marykay Lex, MD      . midazolam (VERSED) 2 MG/2ML  injection           . morphine 2 MG/ML injection 2 mg  2 mg Intravenous Q1H PRN Marykay Lex, MD      . nitroGLYCERIN (NTG ON-CALL) 0.2 mg/mL injection           . ondansetron (ZOFRAN) injection 4 mg  4 mg Intravenous Q6H PRN Marykay Lex, MD      . pantoprazole (PROTONIX) EC tablet 40 mg  40 mg Oral Q1200 Marykay Lex, MD   40 mg at 04/04/12 1745  . sodium chloride 0.9 % injection 3 mL  3 mL Intravenous Q12H Marykay Lex, MD   3 mL at 04/04/12 2200  . sodium chloride 0.9 % injection 3 mL  3 mL Intravenous PRN Marykay Lex, MD      . verapamil (ISOPTIN) 2.5 MG/ML injection           . DISCONTD: 0.9 %  sodium chloride infusion   Intravenous Continuous Marykay Lex, MD 75 mL/hr at 04/04/12 [phone]    .  DISCONTD: aspirin tablet 81 mg  81 mg Oral Daily Marykay Lex, MD      . DISCONTD: omeprazole (PRILOSEC OTC) EC tablet 20 mg  20 mg Oral Daily Marykay Lex, MD      . DISCONTD: sodium chloride 0.9 % injection 3 mL  3 mL Intravenous PRN Marykay Lex, MD        PE: General appearance: alert, cooperative and no distress Lungs: clear to auscultation bilaterally Heart: Reg Rhythm,  Rate slow.  no MM Extremities: No LEE Pulses: Radials 2+ and symmetric. 2+ left PT, 0 left DP.  2+ right DP and PT. Skin: Warm and dry, right radial cath site looks good. Neurologic: Grossly normal  Lab Results:   Basename 04/04/12 0827  WBC 6.0  HGB 14.6  HCT 41.9  PLT 203   BMET  Basename 04/04/12 0827  NA 138  K 3.8  CL 104  CO2 21  GLUCOSE 219*  BUN 28*  CREATININE 0.85  CALCIUM 8.9   PT/INR  Basename 04/04/12 0827  LABPROT 13.4  INR 1.00   Cholesterol  Basename 04/04/12 0827  CHOL 147   Lipid Panel     Component Value Date/Time   CHOL 147 04/04/2012 0827   TRIG 285* 04/04/2012 0827   HDL 20* 04/04/2012 0827   CHOLHDL 7.4 04/04/2012 0827   VLDL 57* 04/04/2012 0827   LDLCALC 70 04/04/2012 0827    Studies/Results: Hemodynamics:  Central Aortic / Mean Pressures:  133/73 mmHg; 96 mmHg  Left Ventricular Pressures / EDP: 128/15 mmHg; 19 mmHg Left Ventriculography:  EF: 55-60 %  Wall Motion: Essentially normal with perhaps a mild mid anterolateral hypokinesis. Coronary Anatomy:  Left Main: Large caliber vessel bifurcates into LAD and Circumflex, angiographically normal LAD: Caliber vessel gives rise to a proximal first diagonal (D1) and septal perforator followed by a tapering lesion going from 50 down to 80% stenosis most notable at the bifurcation of the second diagonal (D2) then tapered back up to normal vessel to rub the remainder of the vessel which wraps around the apex  D1: Ostial hazy 90% stenosis in a small to moderate caliber vessel.  D2: Moderate caliber major diagonal branch with a long ostial proximal 70-80% lesion the severity of which is noted to test in the LAO caudal projection. Left Circumflex: Large caliber vessel bifurcates into a OM1 and the AV groove circumflex. Minimal luminal irregularities.  OM1: Moderate caliber lateral marginal with 3 branches distally. Minimal luminal irregularities.  After giving off a lateral OM, the circumflex flex courses in the AV groove to give off 2 left posterior lateral branches. RCA: Large caliber dominant vessel, several small caliber right ventricular marginal branches before the vessel bifurcates distally into the RPDA and right posterior lateral system. Minimal luminal irregularities throughout the entire RCA system.  RPDA: Moderate caliber vessel tapers down toward the apex several septal perforators.  RPL Sysytem:The Right Posterior AV Groove Branch moderate caliber vessel tapers and a small-caliber vessel with 2 main RPL branches. TR Band: 1134 Hours, 9 mL air  ANESTHESIA: Local Lidocaine 2 ml  SEDATION: 2 mg IV Versed, 50 mcg IV fentanyl ; Premedication: 5mg  PO Valium  MEDICATIONS: Radial Cocktail: 5 mg Verapamil, 400 mcg NTG, 2 ml 2% Lidocaine in 10 ml NS  Anticoagulation: IV Heparin 5000 Units    EBL: < 5 ml  PATIENT DISPOSITION:  The patient was transferred to the PACU holding area in a hemodynamicaly stable, chest pain free condition.  The patient  tolerated the procedure well, and there were no complications.  The patient was stable before, during, and after the procedure. POST-OPERATIVE DIAGNOSIS:  Severe disease in the LAD system involving the ostium and proximal portions of 2 diagonal branches in the intervening LAD segment with a 80% stenosis involving the bifurcation of the LAD and D1. Although this is approachable for percutaneous standpoint, and diabetic patient with borderline compliant arterial conduit bypass surgery with LIMA in either RIMA or radial artery conduit to the diagonal branches would be his best option.  Well-preserved LV EF with normal LVEDP  Although the smallest of the 3 vessels involved, the D1 lesion appears to be the culprit lesion for his resting angina as it is hazy in appearance with the most severe stenosis. PLAN OF CARE:  Admit for overnight observation in order to allow for CT surgery see the patient -- Dr. Clarene Duke has contacted Dr. Laneta Simmers from CT surgery. Reluctant to discharge the patient until plan is that as he has had resting angina now with dynamic changes.  Continue ACE inhibitor as well as aspirin and will add statin.  Oral hypoglycemics for now, holding metformin. Marykay Lex, M.D., M.S.    Assessment/Plan   Principal Problem:  *Unstable angina pectoris - resting chest pain with dynamic Anterior TWI Active Problems:  Diabetes mellitus type 2 in obese -- on oral medication, but non-compliant  Hypertension  CAD (coronary artery disease), native coronary artery - Proximal to Mid LAD 80% involving D1 & D2  Plan:  S/P LHC with severe LAD disease.  CABG planned for Fri.  Arthrogenic Lipid panel despite being having an LDL of 70,  Trig:HDL = 14.25! Target is <1.0.  Five beat V-tach.  He is maintaining bradycardia in the 50's.  Unfortunately  I would not add a beta blocker.    LVEF ~60% by LV gram.     LOS: 1 day    HAGER, BRYAN 04/05/2012 7:42 AM   Patient seen and examined. Agree with assessment and plan. Pt getting PFT's. For CABG on Friday.  Will add fenofibrate to statin. Will try adding very low dose beta-blocker if HR allows.  Lennette Bihari, MD, [facility] 04/05/2012 8:33 AM

## 2012-04-05 NOTE — Progress Notes (Signed)
PFT completed. Unconfirmed results placed in Progress notes of [facility]

## 2012-04-05 NOTE — Plan of Care (Signed)
Problem: Consults Goal: Cardiac Surgery Patient Education ( See Patient Education module for education specifics.)  Outcome: Completed/Met Date Met:  04/05/12 Pt & wife had given heart surgery book & seen video about preparing for heart surgery & recovering from heart surgery.

## 2012-04-06 ENCOUNTER — Ambulatory Visit (HOSPITAL_COMMUNITY): Payer: BC Managed Care – PPO | Admitting: *Deleted

## 2012-04-06 ENCOUNTER — Encounter (HOSPITAL_COMMUNITY): Payer: Self-pay | Admitting: *Deleted

## 2012-04-06 ENCOUNTER — Encounter (HOSPITAL_COMMUNITY)
Admission: RE | Disposition: A | Discharge status: Home / Self Care | Payer: Self-pay | Source: Ambulatory Visit | Attending: Surgery

## 2012-04-06 ENCOUNTER — Inpatient Hospital Stay (HOSPITAL_COMMUNITY): Payer: BC Managed Care – PPO

## 2012-04-06 DIAGNOSIS — I2510 Atherosclerotic heart disease of native coronary artery without angina pectoris: Secondary | ICD-10-CM

## 2012-04-06 HISTORY — PX: CORONARY ARTERY BYPASS GRAFT: SHX141

## 2012-04-06 HISTORY — PX: RADIAL ARTERY HARVEST: SHX5067

## 2012-04-06 LAB — POCT I-STAT 4, (NA,K, GLUC, HGB,HCT)
Glucose, Bld: 101 mg/dL — ABNORMAL HIGH (ref 70–99)
Glucose, Bld: 127 mg/dL — ABNORMAL HIGH (ref 70–99)
Glucose, Bld: 151 mg/dL — ABNORMAL HIGH (ref 70–99)
HCT: 38 % — ABNORMAL LOW (ref 39.0–52.0)
HCT: 28 % — ABNORMAL LOW (ref 39.0–52.0)
HCT: 35 % — ABNORMAL LOW (ref 39.0–52.0)
HCT: 31 % — ABNORMAL LOW (ref 39.0–52.0)
HCT: 33 % — ABNORMAL LOW (ref 39.0–52.0)
Hemoglobin: 11.9 g/dL — ABNORMAL LOW (ref 13.0–17.0)
Hemoglobin: 10.9 g/dL — ABNORMAL LOW (ref 13.0–17.0)
Potassium: 4.1 mEq/L (ref 3.5–5.1)
Potassium: 4.3 mEq/L (ref 3.5–5.1)
Sodium: 141 mEq/L (ref 135–145)
Sodium: 141 mEq/L (ref 135–145)
Sodium: 141 mEq/L (ref 135–145)

## 2012-04-06 LAB — POCT I-STAT 3, ART BLOOD GAS (G3+)
Acid-base deficit: 2 mmol/L (ref 0.0–2.0)
Acid-base deficit: 2 mmol/L (ref 0.0–2.0)
Acid-base deficit: 3 mmol/L — ABNORMAL HIGH (ref 0.0–2.0)
Acid-base deficit: 2 mmol/L (ref 0.0–2.0)
Acid-base deficit: 3 mmol/L — ABNORMAL HIGH (ref 0.0–2.0)
Bicarbonate: 24.4 mEq/L — ABNORMAL HIGH (ref 20.0–24.0)
Bicarbonate: 23.2 mEq/L (ref 20.0–24.0)
O2 Saturation: 96 %
O2 Saturation: 100 %
O2 Saturation: 94 %
O2 Saturation: 94 %
TCO2: 26 mmol/L (ref 0–100)
TCO2: 25 mmol/L (ref 0–100)
pCO2 arterial: 38.7 mmHg (ref 35.0–45.0)
pCO2 arterial: 37 mmHg (ref 35.0–45.0)
pCO2 arterial: 40.9 mmHg (ref 35.0–45.0)
pCO2 arterial: 42.4 mmHg (ref 35.0–45.0)
pH, Arterial: 7.354 (ref 7.350–7.450)
pH, Arterial: 7.336 — ABNORMAL LOW (ref 7.350–7.450)
pO2, Arterial: 87 mmHg (ref 80.0–100.0)
pO2, Arterial: 283 mmHg — ABNORMAL HIGH (ref 80.0–100.0)
pO2, Arterial: 69 mmHg — ABNORMAL LOW (ref 80.0–100.0)
pO2, Arterial: 84 mmHg (ref 80.0–100.0)
pO2, Arterial: 72 mmHg — ABNORMAL LOW (ref 80.0–100.0)

## 2012-04-06 LAB — CBC
HCT: 40.7 % (ref 39.0–52.0)
HCT: 37.5 % — ABNORMAL LOW (ref 39.0–52.0)
Hemoglobin: 13.3 g/dL (ref 13.0–17.0)
MCH: 28.9 pg (ref 26.0–34.0)
MCHC: 34.6 g/dL (ref 30.0–36.0)
MCV: 82.4 fL (ref 78.0–100.0)
MCV: 81.3 fL (ref 78.0–100.0)
Platelets: 159 10*3/uL (ref 150–400)
RBC: 4.12 MIL/uL — ABNORMAL LOW (ref 4.22–5.81)
RBC: 4.6 MIL/uL (ref 4.22–5.81)
RDW: 13.2 % (ref 11.5–15.5)
RDW: 13.2 % (ref 11.5–15.5)
WBC: 12.4 10*3/uL — ABNORMAL HIGH (ref 4.0–10.5)

## 2012-04-06 LAB — BASIC METABOLIC PANEL
BUN: 18 mg/dL (ref 6–23)
Chloride: 103 mEq/L (ref 96–112)
Creatinine, Ser: 0.94 mg/dL (ref 0.50–1.35)
GFR calc Af Amer: >90 mL/min (ref >90)
GFR calc non Af Amer: >90 mL/min (ref >90)

## 2012-04-06 LAB — GLUCOSE, CAPILLARY
Glucose-Capillary: 135 mg/dL — ABNORMAL HIGH (ref 70–99)
Glucose-Capillary: 132 mg/dL — ABNORMAL HIGH (ref 70–99)
Glucose-Capillary: 127 mg/dL — ABNORMAL HIGH (ref 70–99)

## 2012-04-06 LAB — BLOOD GAS, ARTERIAL
Bicarbonate: 23.7 mEq/L (ref 20.0–24.0)
pCO2 arterial: 40.8 mmHg (ref 35.0–45.0)
pH, Arterial: 7.382 (ref 7.350–7.450)
pO2, Arterial: 56.8 mmHg — ABNORMAL LOW (ref 80.0–100.0)

## 2012-04-06 LAB — CREATININE, SERUM
Creatinine, Ser: 0.78 mg/dL (ref 0.50–1.35)
GFR calc Af Amer: >90 mL/min (ref >90)

## 2012-04-06 LAB — PROTIME-INR
INR: 1.3 (ref 0.00–1.49)
Prothrombin Time: 16.4 seconds — ABNORMAL HIGH (ref 11.6–15.2)

## 2012-04-06 LAB — MAGNESIUM: Magnesium: 2.3 mg/dL (ref 1.5–2.5)

## 2012-04-06 LAB — PLATELET COUNT: Platelets: 192 10*3/uL (ref 150–400)

## 2012-04-06 LAB — APTT: aPTT: 29 seconds (ref 24–37)

## 2012-04-06 SURGERY — CORONARY ARTERY BYPASS GRAFTING (CABG)
Anesthesia: General | Site: Chest | Wound class: Clean

## 2012-04-06 MED ORDER — MAGNESIUM SULFATE 40 MG/ML IJ SOLN
4.0000 g | Freq: Once | INTRAMUSCULAR | Status: AC
  Administered 2012-04-06: 4 g via INTRAVENOUS
  Filled 2012-04-06: qty 100

## 2012-04-06 MED ORDER — METOPROLOL TARTRATE 12.5 MG HALF TABLET
12.5000 mg | ORAL_TABLET | Freq: Two times a day (BID) | ORAL | Status: DC
  Filled 2012-04-06 (×3): qty 1

## 2012-04-06 MED ORDER — LACTATED RINGERS IV SOLN
INTRAVENOUS | Status: DC
  Administered 2012-04-07: 20 mL/h via INTRAVENOUS

## 2012-04-06 MED ORDER — BISACODYL 10 MG RE SUPP: 10.0000 mg | Freq: Every day | RECTAL | Status: DC

## 2012-04-06 MED ORDER — THROMBIN 20000 UNITS EX SOLR
CUTANEOUS | Status: AC
  Filled 2012-04-06: qty 20000

## 2012-04-06 MED ORDER — ACETAMINOPHEN 500 MG PO TABS
1000.0000 mg | ORAL_TABLET | Freq: Four times a day (QID) | ORAL | Status: DC
  Administered 2012-04-06 – 2012-04-08 (×6): 1000 mg via ORAL
  Filled 2012-04-06 (×10): qty 2

## 2012-04-06 MED ORDER — SODIUM CHLORIDE 0.9 % IV SOLN: 250.0000 mL | INTRAVENOUS | Status: DC

## 2012-04-06 MED ORDER — 0.9 % SODIUM CHLORIDE (POUR BTL) OPTIME
TOPICAL | Status: DC | PRN
  Administered 2012-04-06: 1000 mL

## 2012-04-06 MED ORDER — ONDANSETRON HCL 4 MG/2ML IJ SOLN: 4.0000 mg | Freq: Four times a day (QID) | INTRAMUSCULAR | Status: DC | PRN

## 2012-04-06 MED ORDER — LACTATED RINGERS IV SOLN
INTRAVENOUS | Status: DC | PRN
  Administered 2012-04-06: 07:00:00 via INTRAVENOUS

## 2012-04-06 MED ORDER — SODIUM CHLORIDE 0.9 % IV SOLN: INTRAVENOUS | Status: DC

## 2012-04-06 MED ORDER — MIDAZOLAM HCL 5 MG/5ML IJ SOLN
INTRAMUSCULAR | Status: DC | PRN
Start: 2012-04-06 — End: 2012-04-06
  Administered 2012-04-06: 2 mg via INTRAVENOUS
  Administered 2012-04-06: 3 mg via INTRAVENOUS
  Administered 2012-04-06 (×2): 2 mg via INTRAVENOUS
  Administered 2012-04-06: 3 mg via INTRAVENOUS
  Administered 2012-04-06 (×2): 2 mg via INTRAVENOUS

## 2012-04-06 MED ORDER — PROPOFOL 10 MG/ML IV EMUL
INTRAVENOUS | Status: DC | PRN
  Administered 2012-04-06: 150 mg via INTRAVENOUS
  Administered 2012-04-06: 50 mg via INTRAVENOUS

## 2012-04-06 MED ORDER — HEPARIN SODIUM (PORCINE) 1000 UNIT/ML IJ SOLN
INTRAMUSCULAR | Status: DC | PRN
  Administered 2012-04-06 (×4): 10000 [IU] via INTRAVENOUS

## 2012-04-06 MED ORDER — PROTAMINE SULFATE 10 MG/ML IV SOLN
INTRAVENOUS | Status: DC | PRN
  Administered 2012-04-06: 10 mg via INTRAVENOUS
  Administered 2012-04-06 (×3): 50 mg via INTRAVENOUS
  Administered 2012-04-06: 40 mg via INTRAVENOUS
  Administered 2012-04-06: 100 mg via INTRAVENOUS

## 2012-04-06 MED ORDER — ACETAMINOPHEN 160 MG/5ML PO SOLN
975.0000 mg | Freq: Four times a day (QID) | ORAL | Status: DC
  Administered 2012-04-06: 975 mg
  Filled 2012-04-06: qty 20.3

## 2012-04-06 MED ORDER — BISACODYL 5 MG PO TBEC
10.0000 mg | DELAYED_RELEASE_TABLET | Freq: Every day | ORAL | Status: DC
  Administered 2012-04-07 – 2012-04-10 (×4): 10 mg via ORAL
  Filled 2012-04-06 (×4): qty 2

## 2012-04-06 MED ORDER — METOPROLOL TARTRATE 1 MG/ML IV SOLN
2.5000 mg | INTRAVENOUS | Status: DC | PRN
  Administered 2012-04-09: 5 mg via INTRAVENOUS
  Filled 2012-04-06: qty 5

## 2012-04-06 MED ORDER — ROCURONIUM BROMIDE 100 MG/10ML IV SOLN
INTRAVENOUS | Status: DC | PRN
  Administered 2012-04-06: 50 mg via INTRAVENOUS

## 2012-04-06 MED ORDER — PANTOPRAZOLE SODIUM 40 MG PO TBEC
40.0000 mg | DELAYED_RELEASE_TABLET | Freq: Every day | ORAL | Status: DC
  Administered 2012-04-08 – 2012-04-12 (×5): 40 mg via ORAL
  Filled 2012-04-06 (×5): qty 1

## 2012-04-06 MED ORDER — SODIUM CHLORIDE 0.9 % IJ SOLN
3.0000 mL | Freq: Two times a day (BID) | INTRAMUSCULAR | Status: DC
  Administered 2012-04-07 – 2012-04-10 (×6): 3 mL via INTRAVENOUS

## 2012-04-06 MED ORDER — DOCUSATE SODIUM 100 MG PO CAPS
200.0000 mg | ORAL_CAPSULE | Freq: Every day | ORAL | Status: DC
  Administered 2012-04-07 – 2012-04-12 (×6): 200 mg via ORAL
  Filled 2012-04-06: qty 2
  Filled 2012-04-06: qty 1
  Filled 2012-04-06 (×5): qty 2

## 2012-04-06 MED ORDER — ASPIRIN EC 325 MG PO TBEC
325.0000 mg | DELAYED_RELEASE_TABLET | Freq: Every day | ORAL | Status: DC
  Administered 2012-04-07 – 2012-04-12 (×6): 325 mg via ORAL
  Filled 2012-04-06 (×7): qty 1

## 2012-04-06 MED ORDER — FENTANYL CITRATE 0.05 MG/ML IJ SOLN
INTRAMUSCULAR | Status: DC | PRN
  Administered 2012-04-06: 150 ug via INTRAVENOUS
  Administered 2012-04-06: 250 ug via INTRAVENOUS
  Administered 2012-04-06: 100 ug via INTRAVENOUS
  Administered 2012-04-06: 250 ug via INTRAVENOUS
  Administered 2012-04-06: 100 ug via INTRAVENOUS
  Administered 2012-04-06: 50 ug via INTRAVENOUS
  Administered 2012-04-06: 100 ug via INTRAVENOUS
  Administered 2012-04-06: 50 ug via INTRAVENOUS
  Administered 2012-04-06: 100 ug via INTRAVENOUS
  Administered 2012-04-06 (×3): 250 ug via INTRAVENOUS
  Administered 2012-04-06: 100 ug via INTRAVENOUS
  Administered 2012-04-06: 150 ug via INTRAVENOUS
  Administered 2012-04-06: 100 ug via INTRAVENOUS

## 2012-04-06 MED ORDER — ACETAMINOPHEN 650 MG RE SUPP
650.0000 mg | RECTAL | Status: AC
  Administered 2012-04-06: 650 mg via RECTAL

## 2012-04-06 MED ORDER — EPHEDRINE SULFATE 50 MG/ML IJ SOLN
INTRAMUSCULAR | Status: DC | PRN
  Administered 2012-04-06: 5 mg via INTRAVENOUS

## 2012-04-06 MED ORDER — LACTATED RINGERS IV SOLN: 500.0000 mL | Freq: Once | INTRAVENOUS | Status: AC | PRN

## 2012-04-06 MED ORDER — ALBUMIN HUMAN 5 % IV SOLN: 250.0000 mL | INTRAVENOUS | Status: AC | PRN

## 2012-04-06 MED ORDER — OXYCODONE HCL 5 MG PO TABS
5.0000 mg | ORAL_TABLET | ORAL | Status: DC | PRN
  Administered 2012-04-07 – 2012-04-09 (×11): 10 mg via ORAL
  Filled 2012-04-06 (×11): qty 2

## 2012-04-06 MED ORDER — TRANEXAMIC ACID 100 MG/ML IV SOLN
1.5000 mg/kg/h | INTRAVENOUS | Status: DC
  Filled 2012-04-06: qty 25

## 2012-04-06 MED ORDER — KETOROLAC TROMETHAMINE 15 MG/ML IJ SOLN
15.0000 mg | Freq: Four times a day (QID) | INTRAMUSCULAR | Status: DC | PRN
  Administered 2012-04-06 – 2012-04-09 (×5): 15 mg via INTRAVENOUS
  Filled 2012-04-06 (×5): qty 1

## 2012-04-06 MED ORDER — SODIUM CHLORIDE 0.9 % IJ SOLN: 3.0000 mL | INTRAMUSCULAR | Status: DC | PRN

## 2012-04-06 MED ORDER — INSULIN REGULAR BOLUS VIA INFUSION
0.0000 [IU] | Freq: Three times a day (TID) | INTRAVENOUS | Status: DC
  Filled 2012-04-06: qty 10

## 2012-04-06 MED ORDER — MORPHINE SULFATE 2 MG/ML IJ SOLN: 1.0000 mg | INTRAMUSCULAR | Status: AC | PRN

## 2012-04-06 MED ORDER — MIDAZOLAM HCL 2 MG/2ML IJ SOLN: 2.0000 mg | INTRAMUSCULAR | Status: DC | PRN

## 2012-04-06 MED ORDER — FAMOTIDINE IN NACL 20-0.9 MG/50ML-% IV SOLN
20.0000 mg | Freq: Two times a day (BID) | INTRAVENOUS | Status: AC
  Administered 2012-04-06 (×2): 20 mg via INTRAVENOUS
  Filled 2012-04-06: qty 50

## 2012-04-06 MED ORDER — POTASSIUM CHLORIDE 10 MEQ/50ML IV SOLN
10.0000 meq | INTRAVENOUS | Status: AC
  Administered 2012-04-06 (×3): 10 meq via INTRAVENOUS

## 2012-04-06 MED ORDER — MORPHINE SULFATE 2 MG/ML IJ SOLN
2.0000 mg | INTRAMUSCULAR | Status: DC | PRN
  Administered 2012-04-06 (×2): 2 mg via INTRAVENOUS
  Administered 2012-04-06 – 2012-04-10 (×7): 4 mg via INTRAVENOUS
  Filled 2012-04-06: qty 1
  Filled 2012-04-06 (×4): qty 2
  Filled 2012-04-06: qty 1
  Filled 2012-04-06 (×3): qty 2

## 2012-04-06 MED ORDER — SODIUM CHLORIDE 0.45 % IV SOLN: INTRAVENOUS | Status: DC

## 2012-04-06 MED ORDER — METOPROLOL TARTRATE 25 MG/10 ML ORAL SUSPENSION
12.5000 mg | Freq: Two times a day (BID) | ORAL | Status: DC
  Administered 2012-04-06: 12.5 mg
  Filled 2012-04-06 (×3): qty 5

## 2012-04-06 MED ORDER — SODIUM CHLORIDE 0.9 % IV SOLN
INTRAVENOUS | Status: DC
  Filled 2012-04-06: qty 1

## 2012-04-06 MED ORDER — MOXIFLOXACIN HCL IN NACL 400 MG/250ML IV SOLN
400.0000 mg | INTRAVENOUS | Status: AC
  Administered 2012-04-07: 400 mg via INTRAVENOUS
  Filled 2012-04-06: qty 250

## 2012-04-06 MED ORDER — ISOSORBIDE MONONITRATE 15 MG HALF TABLET
15.0000 mg | ORAL_TABLET | Freq: Every day | ORAL | Status: DC
  Administered 2012-04-07 – 2012-04-12 (×6): 15 mg via ORAL
  Filled 2012-04-06 (×6): qty 1

## 2012-04-06 MED ORDER — ACETAMINOPHEN 160 MG/5ML PO SOLN: 650.0000 mg | ORAL | Status: AC

## 2012-04-06 MED ORDER — NITROGLYCERIN IN D5W 200-5 MCG/ML-% IV SOLN: 0.0000 ug/min | INTRAVENOUS | Status: DC

## 2012-04-06 MED ORDER — DEXMEDETOMIDINE HCL IN NACL 400 MCG/100ML IV SOLN
0.1000 ug/kg/h | INTRAVENOUS | Status: DC
  Filled 2012-04-06: qty 100

## 2012-04-06 MED ORDER — VECURONIUM BROMIDE 10 MG IV SOLR
INTRAVENOUS | Status: DC | PRN
  Administered 2012-04-06 (×3): 10 mg via INTRAVENOUS

## 2012-04-06 MED ORDER — DEXMEDETOMIDINE HCL IN NACL 200 MCG/50ML IV SOLN: 0.1000 ug/kg/h | INTRAVENOUS | Status: DC

## 2012-04-06 MED ORDER — VANCOMYCIN HCL IN DEXTROSE 1-5 GM/200ML-% IV SOLN
1000.0000 mg | Freq: Once | INTRAVENOUS | Status: AC
  Administered 2012-04-06: 1000 mg via INTRAVENOUS
  Filled 2012-04-06: qty 200

## 2012-04-06 MED ORDER — HEMOSTATIC AGENTS (NO CHARGE) OPTIME
TOPICAL | Status: DC | PRN
  Administered 2012-04-06 (×2): 1 via TOPICAL

## 2012-04-06 MED ORDER — SUCCINYLCHOLINE CHLORIDE 20 MG/ML IJ SOLN
INTRAMUSCULAR | Status: DC | PRN
  Administered 2012-04-06: 100 mg via INTRAVENOUS

## 2012-04-06 MED ORDER — PHENYLEPHRINE HCL 10 MG/ML IJ SOLN
0.0000 ug/min | INTRAMUSCULAR | Status: DC
  Filled 2012-04-06: qty 2

## 2012-04-06 MED ORDER — THROMBIN 20000 UNITS EX KIT
PACK | CUTANEOUS | Status: DC | PRN
  Administered 2012-04-06: 20000 [IU] via TOPICAL

## 2012-04-06 MED ORDER — ASPIRIN 81 MG PO CHEW: 324.0000 mg | CHEWABLE_TABLET | Freq: Every day | ORAL | Status: DC

## 2012-04-06 SURGICAL SUPPLY — 109 items
ATTRACTOMAT 16X20 MAGNETIC DRP (DRAPES) ×3 IMPLANT
BAG DECANTER FOR FLEXI CONT (MISCELLANEOUS) ×3 IMPLANT
BANDAGE ELASTIC 4 VELCRO ST LF (GAUZE/BANDAGES/DRESSINGS) ×5 IMPLANT
BANDAGE ELASTIC 6 VELCRO ST LF (GAUZE/BANDAGES/DRESSINGS) ×4 IMPLANT
BANDAGE GAUZE ELAST BULKY 4 IN (GAUZE/BANDAGES/DRESSINGS) ×5 IMPLANT
BASKET HEART (ORDER IN 25'S) (MISCELLANEOUS) ×1
BASKET HEART (ORDER IN 25S) (MISCELLANEOUS) ×2 IMPLANT
BLADE STERNUM SYSTEM 6 (BLADE) ×3 IMPLANT
BLADE SURG 15 STRL LF DISP TIS (BLADE) ×2 IMPLANT
BLADE SURG 15 STRL SS (BLADE) ×3
CANISTER SUCTION 2500CC (MISCELLANEOUS) ×3 IMPLANT
CANN PRFSN .5XCNCT 15X34-48 (MISCELLANEOUS) ×2
CANNULA ARTERIAL NVNT 3/8 22FR (MISCELLANEOUS) ×1 IMPLANT
CANNULA PRFSN .5XCNCT 15X34-48 (MISCELLANEOUS) IMPLANT
CANNULA VEN 2 STAGE (MISCELLANEOUS) ×3
CATH ROBINSON RED A/P 18FR (CATHETERS) ×6 IMPLANT
CATH THORACIC 28FR (CATHETERS) ×3 IMPLANT
CATH THORACIC 28FR RT ANG (CATHETERS) IMPLANT
CATH THORACIC 36FR (CATHETERS) ×3 IMPLANT
CATH THORACIC 36FR RT ANG (CATHETERS) ×3 IMPLANT
CLIP TI MEDIUM 24 (CLIP) IMPLANT
CLIP TI WIDE RED SMALL 24 (CLIP) ×2 IMPLANT
CLOTH BEACON ORANGE TIMEOUT ST (SAFETY) ×3 IMPLANT
COVER MAYO STAND STRL (DRAPES) ×3 IMPLANT
COVER SURGICAL LIGHT HANDLE (MISCELLANEOUS) ×6 IMPLANT
CRADLE DONUT ADULT HEAD (MISCELLANEOUS) ×3 IMPLANT
DRAPE CARDIOVASCULAR INCISE (DRAPES) ×3
DRAPE EXTREMITY T 121X128X90 (DRAPE) ×3 IMPLANT
DRAPE PROXIMA HALF (DRAPES) ×3 IMPLANT
DRAPE SLUSH MACHINE 52X66 (DRAPES) IMPLANT
DRAPE SLUSH/WARMER DISC (DRAPES) ×1 IMPLANT
DRAPE SRG 135X102X78XABS (DRAPES) ×2 IMPLANT
DRSG COVADERM 4X14 (GAUZE/BANDAGES/DRESSINGS) ×3 IMPLANT
ELECT CAUTERY BLADE 6.4 (BLADE) ×3 IMPLANT
ELECT REM PT RETURN 9FT ADLT (ELECTROSURGICAL) ×6
ELECTRODE REM PT RTRN 9FT ADLT (ELECTROSURGICAL) ×4 IMPLANT
GEL ULTRASOUND 20GR AQUASONIC (MISCELLANEOUS) ×3 IMPLANT
GLOVE BIO SURGEON STRL SZ 6 (GLOVE) IMPLANT
GLOVE BIO SURGEON STRL SZ 6.5 (GLOVE) ×6 IMPLANT
GLOVE BIO SURGEON STRL SZ7 (GLOVE) IMPLANT
GLOVE BIO SURGEON STRL SZ7.5 (GLOVE) IMPLANT
GLOVE BIOGEL PI IND STRL 6 (GLOVE) IMPLANT
GLOVE BIOGEL PI IND STRL 6.5 (GLOVE) IMPLANT
GLOVE BIOGEL PI IND STRL 7.0 (GLOVE) IMPLANT
GLOVE BIOGEL PI INDICATOR 6 (GLOVE)
GLOVE BIOGEL PI INDICATOR 6.5 (GLOVE) ×2
GLOVE BIOGEL PI INDICATOR 7.0 (GLOVE)
GLOVE EUDERMIC 7 POWDERFREE (GLOVE) ×6 IMPLANT
GLOVE ORTHO TXT STRL SZ7.5 (GLOVE) IMPLANT
GOWN PREVENTION PLUS XLARGE (GOWN DISPOSABLE) ×3 IMPLANT
GOWN STRL NON-REIN LRG LVL3 (GOWN DISPOSABLE) ×12 IMPLANT
HARMONIC SHEARS 14CM COAG (MISCELLANEOUS) ×3 IMPLANT
HEMOSTAT POWDER SURGIFOAM 1G (HEMOSTASIS) ×9 IMPLANT
HEMOSTAT SURGICEL 2X14 (HEMOSTASIS) ×3 IMPLANT
INSERT FOGARTY 61MM (MISCELLANEOUS) IMPLANT
INSERT FOGARTY XLG (MISCELLANEOUS) IMPLANT
KIT BASIN OR (CUSTOM PROCEDURE TRAY) ×3 IMPLANT
KIT CATH CPB BARTLE (MISCELLANEOUS) ×3 IMPLANT
KIT ROOM TURNOVER OR (KITS) ×3 IMPLANT
KIT SUCTION CATH 14FR (SUCTIONS) ×3 IMPLANT
KIT VASOVIEW W/TROCAR VH 2000 (KITS) ×3 IMPLANT
NS IRRIG 1000ML POUR BTL (IV SOLUTION) ×15 IMPLANT
PACK OPEN HEART (CUSTOM PROCEDURE TRAY) ×3 IMPLANT
PAD ARMBOARD 7.5X6 YLW CONV (MISCELLANEOUS) ×12 IMPLANT
PENCIL BUTTON HOLSTER BLD 10FT (ELECTRODE) ×3 IMPLANT
PUNCH AORTIC ROTATE 4.0MM (MISCELLANEOUS) ×1 IMPLANT
PUNCH AORTIC ROTATE 4.5MM 8IN (MISCELLANEOUS) ×2 IMPLANT
PUNCH AORTIC ROTATE 5MM 8IN (MISCELLANEOUS) IMPLANT
SET CARDIOPLEGIA MPS 5001102 (MISCELLANEOUS) ×1 IMPLANT
SPONGE GAUZE 4X4 12PLY (GAUZE/BANDAGES/DRESSINGS) ×7 IMPLANT
SPONGE INTESTINAL PEANUT (DISPOSABLE) IMPLANT
SPONGE LAP 18X18 X RAY DECT (DISPOSABLE) IMPLANT
SPONGE LAP 4X18 X RAY DECT (DISPOSABLE) ×3 IMPLANT
SUT BONE WAX W31G (SUTURE) ×3 IMPLANT
SUT MNCRL AB 4-0 PS2 18 (SUTURE) IMPLANT
SUT PROLENE 3 0 SH DA (SUTURE) IMPLANT
SUT PROLENE 3 0 SH1 36 (SUTURE) ×3 IMPLANT
SUT PROLENE 4 0 RB 1 (SUTURE)
SUT PROLENE 4 0 SH DA (SUTURE) IMPLANT
SUT PROLENE 4-0 RB1 .5 CRCL 36 (SUTURE) IMPLANT
SUT PROLENE 5 0 C 1 36 (SUTURE) IMPLANT
SUT PROLENE 6 0 C 1 30 (SUTURE) IMPLANT
SUT PROLENE 7 0 BV 1 (SUTURE) IMPLANT
SUT PROLENE 7 0 BV1 MDA (SUTURE) ×3 IMPLANT
SUT PROLENE 8 0 BV175 6 (SUTURE) ×2 IMPLANT
SUT SILK  1 MH (SUTURE)
SUT SILK 1 MH (SUTURE) IMPLANT
SUT STEEL STERNAL CCS#1 18IN (SUTURE) IMPLANT
SUT STEEL SZ 6 DBL 3X14 BALL (SUTURE) IMPLANT
SUT VIC AB 1 CTX 36 (SUTURE) ×6
SUT VIC AB 1 CTX36XBRD ANBCTR (SUTURE) ×4 IMPLANT
SUT VIC AB 2-0 CT1 27 (SUTURE)
SUT VIC AB 2-0 CT1 TAPERPNT 27 (SUTURE) IMPLANT
SUT VIC AB 2-0 CTX 27 (SUTURE) IMPLANT
SUT VIC AB 3-0 SH 27 (SUTURE) ×3
SUT VIC AB 3-0 SH 27X BRD (SUTURE) IMPLANT
SUT VIC AB 3-0 X1 27 (SUTURE) ×3 IMPLANT
SUT VICRYL 4-0 PS2 18IN ABS (SUTURE) IMPLANT
SUTURE E-PAK OPEN HEART (SUTURE) ×3 IMPLANT
SYR 50ML SLIP (SYRINGE) ×2 IMPLANT
SYSTEM SAHARA CHEST DRAIN ATS (WOUND CARE) ×3 IMPLANT
TAPE CLOTH SURG 4X10 WHT LF (GAUZE/BANDAGES/DRESSINGS) ×1 IMPLANT
TOWEL OR 17X24 6PK STRL BLUE (TOWEL DISPOSABLE) ×3 IMPLANT
TOWEL OR 17X26 10 PK STRL BLUE (TOWEL DISPOSABLE) ×3 IMPLANT
TRAY FOLEY IC TEMP SENS 14FR (CATHETERS) ×2 IMPLANT
TUBE SUCT INTRACARD DLP 20F (MISCELLANEOUS) ×3 IMPLANT
TUBING INSUFFLATION 10FT LAP (TUBING) ×2 IMPLANT
UNDERPAD 30X30 INCONTINENT (UNDERPADS AND DIAPERS) ×6 IMPLANT
WATER STERILE IRR 1000ML POUR (IV SOLUTION) ×6 IMPLANT

## 2012-04-06 NOTE — Brief Op Note (Addendum)
04/04/2012 - 04/06/2012  12:21 PM  PATIENT:  Gregory Reese  56 y.o. male  PRE-OPERATIVE DIAGNOSIS:  CAD  POST-OPERATIVE DIAGNOSIS:  CAD  PROCEDURE:  CORONARY ARTERY BYPASS GRAFTING (CABG)x 3 (LIMA to LAD, Left radial artery sequentially to Diagonal 1 and Diagonal 2) with Open RADIAL ARTERY HARVEST (Left)   SURGEON:  Surgeon(s) and Role: Panel 1:    * Alleen Borne, MD - Primary  Panel 2:    * Alleen Borne, MD - Primary  PHYSICIAN ASSISTANT: Doree Fudge PA-C   ANESTHESIA:   general  EBL:  Total I/O In: 900 [I.V.:900] Out: 1325 [Urine:1325]   DRAINS:  Chest Tube(s) in the Mediastinal and Pleural spaces    COUNTS CORRECT:  YES   DICTATION: .Dragon Dictation  PLAN OF CARE: Admit to inpatient   PATIENT DISPOSITION:  ICU - intubated and hemodynamically stable.   Delay start of Pharmacological VTE agent (>24hrs) due to surgical blood loss or risk of bleeding: yes  PRE OP WEIGHT: 112 kg

## 2012-04-06 NOTE — Procedures (Addendum)
Extubation Procedure Note  Patient Details:   Name: Gregory Reese DOB: July 28, 1956 MRN: [id number]   Airway Documentation:  Airway 8 mm (Active)  Secured at (cm) 24 cm 04/06/2012  5:17 PM  Measured From Lips 04/06/2012  5:17 PM  Secured Location Left 04/06/2012  5:17 PM  Secured By Caron Presume Tape 04/06/2012  5:17 PM    Evaluation  O2 sats: stable throughout and currently acceptable Complications: No apparent complications Patient did tolerate procedure well. Bilateral Breath Sounds: Clear   Yes  Positive Cuff Leak. NIF -40, VC 1.15  Antoine Poche 04/06/2012, 6:00 PM

## 2012-04-06 NOTE — Progress Notes (Signed)
Patient ID: Gregory Reese, male   DOB: February 10, 1956, 56 y.o.   MRN: [id number]  SICU evening rounds:  Hemodynamically stable Extubated Urine output good  Ct output low  CBC    Component Value Date/Time   WBC 12.4* 04/06/2012 1414   RBC 4.12* 04/06/2012 1414   HGB 11.2* 04/06/2012 1419   HCT 33.0* 04/06/2012 1419   PLT 159 04/06/2012 1414   MCV 81.3 04/06/2012 1414   MCH 28.9 04/06/2012 1414   MCHC 35.5 04/06/2012 1414   RDW 13.2 04/06/2012 1414    BMET    Component Value Date/Time   NA 142 04/06/2012 1419   K 3.7 04/06/2012 1419   CL 103 04/06/2012 0500   CO2 25 04/06/2012 0500   GLUCOSE 121* 04/06/2012 1419   BUN 18 04/06/2012 0500   CREATININE 0.94 04/06/2012 0500   CALCIUM 9.3 04/06/2012 0500   GFRNONAA >90 04/06/2012 0500   GFRAA >90 04/06/2012 0500    6hr labs pending

## 2012-04-06 NOTE — Progress Notes (Signed)
INITIAL ADULT NUTRITION ASSESSMENT Date: 04/06/2012   Time: 3:28 PM  INTERVENTION:  If EN started, recommend Glucerna 1.2 formula at 30 ml/hr with Prostat liquid protein 6 times via tube to provide 1464 total kcals (68% of estimated kcal needs), 133 gm protein (95% of estimated protein needs), 580 ml of free water  Liquid MVI via tube RD to follow for nutrition care plan  Reason for Assessment: VDRF  ASSESSMENT: Male 56 y.o.  Dx: Unstable angina pectoris  Hx:  Past Medical History  Diagnosis Date  . Kidney stones   . Hypertension   . Type II diabetes mellitus     Related Meds:     . acetaminophen (TYLENOL) oral liquid 160 mg/5 mL  650 mg Per Tube NOW   Or  . acetaminophen  650 mg Rectal NOW  . acetaminophen  1,000 mg Oral Q6H   Or  . acetaminophen (TYLENOL) oral liquid 160 mg/5 mL  975 mg Per Tube Q6H  . aspirin EC  325 mg Oral Daily   Or  . aspirin  324 mg Per Tube Daily  . bisacodyl  10 mg Oral Daily   Or  . bisacodyl  10 mg Rectal Daily  . bisacodyl  5 mg Oral Once  . chlorhexidine  60 mL Topical Once  . dexmedetomidine  0.1-0.7 mcg/kg/hr Intravenous To OR  . diazepam  10 mg Oral Once  . docusate sodium  200 mg Oral Daily  . famotidine (PEPCID) IV  20 mg Intravenous Q12H  . fenofibrate  160 mg Oral Daily  . insulin (NOVOLIN-R) infusion   Intravenous To OR  . insulin regular  0-10 Units Intravenous TID WC  . isosorbide mononitrate  15 mg Oral Daily  . magnesium sulfate  4 g Intravenous Once  . metoprolol tartrate  12.5 mg Oral BID   Or  . metoprolol tartrate  12.5 mg Per Tube BID  . moxifloxacin  400 mg Intravenous To OR  . moxifloxacin  400 mg Intravenous Q24H  . nitroGLYCERIN  2-200 mcg/min Intravenous To OR  . nitroglycerin-nicardipine-HEPARIN-sodium bicarbonate irrigation for artery spasm   Irrigation To OR  . pantoprazole  40 mg Oral Q1200  . potassium chloride  10 mEq Intravenous Q1 Hr x 3  . sodium chloride  3 mL Intravenous Q12H  . tranexamic  acid  15 mg/kg Intravenous To OR  . tranexamic acid (CYKLOKAPRON) infusion ([facility])  1.5 mg/kg/hr Intravenous To OR  . vancomycin  1,500 mg Intravenous To OR  . vancomycin  1,000 mg Intravenous Once  . DISCONTD: aspirin EC  81 mg Oral Daily  . DISCONTD: atorvastatin  80 mg Oral q1800  . DISCONTD: dexmedetomidine  0.1-0.7 mcg/kg/hr Intravenous To OR  . DISCONTD: DOPamine  2-20 mcg/kg/min Intravenous To OR  . DISCONTD: epinephrine  0.5-20 mcg/min Intravenous To OR  . DISCONTD: going for heart surgery book   Does not apply Once  . DISCONTD: insulin aspart  0-9 Units Subcutaneous TID WC  . DISCONTD: linagliptin  5 mg Oral Q breakfast  . DISCONTD: lisinopril  40 mg Oral Daily  . DISCONTD: magnesium sulfate  40 mEq Other To OR  . DISCONTD: metoprolol tartrate  12.5 mg Oral BID  . DISCONTD: metoprolol tartrate  12.5 mg Oral Once  . DISCONTD: pantoprazole  40 mg Oral Q1200  . DISCONTD: phenylephrine (NEO-SYNEPHRINE) Adult infusion  30-200 mcg/min Intravenous To OR  . DISCONTD: potassium chloride  80 mEq Other To OR  . DISCONTD: sodium chloride  3  mL Intravenous Q12H  . DISCONTD: tranexamic acid  2 mg/kg Intracatheter To OR  . DISCONTD: tranexamic acid (CYKLOKAPRON) infusion ([facility])  1.5 mg/kg/hr Intravenous To OR    Ht: 6\' 1"  (185.4 cm)  Wt: 247 lb 1.6 oz (112.084 kg)  Ideal Wt: 83.6 kg % Ideal Wt: 134%  Usual Wt: unable to obtain % Usual Wt: ---  Body mass index is 32.60 kg/(m^2).  Food/Nutrition Related Hx: no triggers per admission nutrition screen  Labs:  CMP     Component Value Date/Time   NA 142 04/06/2012 1419   K 3.7 04/06/2012 1419   CL 103 04/06/2012 0500   CO2 25 04/06/2012 0500   GLUCOSE 121* 04/06/2012 1419   BUN 18 04/06/2012 0500   CREATININE 0.94 04/06/2012 0500   CALCIUM 9.3 04/06/2012 0500   PROT 6.8 04/04/2012 0827   ALBUMIN 3.7 04/04/2012 0827   AST 16 04/04/2012 0827   ALT 24 04/04/2012 0827   ALKPHOS 54 04/04/2012 0827   BILITOT 0.4 04/04/2012 0827   GFRNONAA >90  04/06/2012 0500   GFRAA >90 04/06/2012 0500     Intake/Output Summary (Last 24 hours) at 04/06/12 1529 Last data filed at 04/06/12 1500  Gross per 24 hour  Intake 3902.95 ml  Output   3470 ml  Net 432.95 ml    CBG (last 3)   Basename 04/05/12 2137 04/05/12 1740 04/05/12 1321  GLUCAP 235* 167* 197*    Diet Order: NPO  Supplements/Tube Feeding: N/A  IVF:    sodium chloride Last Rate: 20 mL/hr at 04/06/12 1415  sodium chloride Last Rate: 20 mL/hr at 04/06/12 1415  sodium chloride   dexmedetomidine Last Rate: 0.7 mcg/kg/hr (04/06/12 1500)  insulin (NOVOLIN-R) infusion Last Rate: 3.1 mL/hr at 04/06/12 1415  lactated ringers   nitroGLYCERIN Last Rate: 15 mcg/min (04/06/12 1500)  phenylephrine (NEO-SYNEPHRINE) Adult infusion Last Rate: Stopped (04/06/12 1415)  DISCONTD: sodium chloride Last Rate: 50 mL/hr at 04/06/12 0200    Estimated Nutritional Needs:   Kcal: 2135 Protein: 140-150 gm Fluid: 2.1-2.2 L  Patient is currently intubated on ventilator support MV: 9.5 Temp: 36.7  S/p CABG 8/30; OGT in place; + Precedex; no skin breakdown.  NUTRITION DIAGNOSIS: -Inadequate oral intake (NI-2.1).  Status: Ongoing  RELATED TO: inability to eat, VDRF  AS EVIDENCE BY: NPO status  MONITORING/EVALUATION(Goals): Goal: Initiate EN support within next 24-48 hours if prolonged intubation expected Monitor: EN initiation, respiratory status, weight, labs, I/O's  EDUCATION NEEDS: -No education needs identified at this time  Dietitian #: [phone]  DOCUMENTATION CODES Per approved criteria  -Obesity Unspecified    Alger Memos 04/06/2012, 3:28 PM

## 2012-04-06 NOTE — Transfer of Care (Signed)
Immediate Anesthesia Transfer of Care Note  Patient: Gregory Reese  Procedure(s) Performed: Procedure(s) (LRB): CORONARY ARTERY BYPASS GRAFTING (CABG) (N/A) RADIAL ARTERY HARVEST (Left) RADIAL ARTERY HARVEST (Left)  Patient Location: SICU  Anesthesia Type: General  Level of Consciousness: sedated and Patient remains intubated per anesthesia plan  Airway & Oxygen Therapy: Patient remains intubated per anesthesia plan  Post-op Assessment: Report given to PACU RN and Post -op Vital signs reviewed and stable  Post vital signs: Reviewed and stable  Complications: No apparent anesthesia complications

## 2012-04-06 NOTE — Plan of Care (Signed)
Problem: Phase II Progression Outcomes Goal: Patient extubated within - Outcome: Completed/Met Date Met:  04/06/12 Extubated within 6 hours

## 2012-04-06 NOTE — Preoperative (Signed)
Beta Blockers   Reason not to administer Beta Blockers:Not Applicable, took at 2230 8/29

## 2012-04-06 NOTE — Anesthesia Preprocedure Evaluation (Signed)
Anesthesia Evaluation  Patient identified by MRN, date of birth, ID band Patient awake    Reviewed: Allergy & Precautions, H&P , NPO status , Patient's Chart, lab work & pertinent test results  History of Anesthesia Complications Negative for: history of anesthetic complications  Airway Mallampati: III TM Distance: <3 FB Neck ROM: Full    Dental  (+) Caps, Dental Advisory Given and Teeth Intact   Pulmonary neg pulmonary ROS,  breath sounds clear to auscultation  Pulmonary exam normal       Cardiovascular hypertension, Pt. on medications + angina with exertion + CAD Rhythm:Regular Rate:Normal     Neuro/Psych negative neurological ROS     GI/Hepatic Neg liver ROS,   Endo/Other  Oral Hypoglycemic Agents  Renal/GU Renal InsufficiencyRenal disease     Musculoskeletal   Abdominal   Peds  Hematology   Anesthesia Other Findings   Reproductive/Obstetrics                           Anesthesia Physical Anesthesia Plan  ASA: IV  Anesthesia Plan: General   Post-op Pain Management:    Induction: Intravenous  Airway Management Planned: Oral ETT  Additional Equipment: Arterial line, TEE and PA Cath  Intra-op Plan:   Post-operative Plan: Post-operative intubation/ventilation  Informed Consent: I have reviewed the patients History and Physical, chart, labs and discussed the procedure including the risks, benefits and alternatives for the proposed anesthesia with the patient or authorized representative who has indicated his/her understanding and acceptance.     Plan Discussed with: CRNA, Anesthesiologist and Surgeon  Anesthesia Plan Comments:         Anesthesia Quick Evaluation

## 2012-04-06 NOTE — Progress Notes (Signed)
[facility] Progress Note  Subjective:  Back from OR; S?P CABG x 3 with LIMA to LAD, sequential Left Radial to Dx1 and Dx2  Objective:   Vital Signs in the last 24 hours: Temp:  [97.5 F (36.4 C)-99.1 F (37.3 C)] 99.1 F (37.3 C) (08/30 1600) Pulse Rate:  [53-80] 80  (08/30 1600) Resp:  [11-18] 18  (08/30 1600) BP: (85-148)/(62-78) 85/67 mmHg (08/30 1600) SpO2:  [98 %-100 %] 100 % (08/30 1600) Arterial Line BP: (94-123)/(54-65) 94/55 mmHg (08/30 1600) FiO2 (%):  [50 %] 50 % (08/30 1416)  Intake/Output from previous day: 08/29 0701 - 08/30 0700 In: 1565 [P.O.:240; I.V.:1325] Out: 450 [Urine:450]  Scheduled:   . acetaminophen (TYLENOL) oral liquid 160 mg/5 mL  650 mg Per Tube NOW   Or  . acetaminophen  650 mg Rectal NOW  . acetaminophen  1,000 mg Oral Q6H   Or  . acetaminophen (TYLENOL) oral liquid 160 mg/5 mL  975 mg Per Tube Q6H  . aspirin EC  325 mg Oral Daily   Or  . aspirin  324 mg Per Tube Daily  . bisacodyl  10 mg Oral Daily   Or  . bisacodyl  10 mg Rectal Daily  . bisacodyl  5 mg Oral Once  . chlorhexidine  60 mL Topical Once  . dexmedetomidine  0.1-0.7 mcg/kg/hr Intravenous To OR  . diazepam  10 mg Oral Once  . docusate sodium  200 mg Oral Daily  . famotidine (PEPCID) IV  20 mg Intravenous Q12H  . fenofibrate  160 mg Oral Daily  . insulin (NOVOLIN-R) infusion   Intravenous To OR  . insulin regular  0-10 Units Intravenous TID WC  . isosorbide mononitrate  15 mg Oral Daily  . magnesium sulfate  4 g Intravenous Once  . metoprolol tartrate  12.5 mg Oral BID   Or  . metoprolol tartrate  12.5 mg Per Tube BID  . moxifloxacin  400 mg Intravenous To OR  . moxifloxacin  400 mg Intravenous Q24H  . nitroGLYCERIN  2-200 mcg/min Intravenous To OR  . nitroglycerin-nicardipine-HEPARIN-sodium bicarbonate irrigation for artery spasm   Irrigation To OR  . pantoprazole  40 mg Oral Q1200  . potassium chloride  10 mEq Intravenous Q1 Hr x 3  .  sodium chloride  3 mL Intravenous Q12H  . tranexamic acid  15 mg/kg Intravenous To OR  . tranexamic acid (CYKLOKAPRON) infusion ([facility])  1.5 mg/kg/hr Intravenous To OR  . vancomycin  1,500 mg Intravenous To OR  . vancomycin  1,000 mg Intravenous Once  . DISCONTD: aspirin EC  81 mg Oral Daily  . DISCONTD: atorvastatin  80 mg Oral q1800  . DISCONTD: dexmedetomidine  0.1-0.7 mcg/kg/hr Intravenous To OR  . DISCONTD: DOPamine  2-20 mcg/kg/min Intravenous To OR  . DISCONTD: epinephrine  0.5-20 mcg/min Intravenous To OR  . DISCONTD: going for heart surgery book   Does not apply Once  . DISCONTD: insulin aspart  0-9 Units Subcutaneous TID WC  . DISCONTD: linagliptin  5 mg Oral Q breakfast  . DISCONTD: lisinopril  40 mg Oral Daily  . DISCONTD: magnesium sulfate  40 mEq Other To OR  . DISCONTD: metoprolol tartrate  12.5 mg Oral BID  . DISCONTD: metoprolol tartrate  12.5 mg Oral Once  . DISCONTD: pantoprazole  40 mg Oral Q1200  . DISCONTD: phenylephrine (NEO-SYNEPHRINE) Adult infusion  30-200 mcg/min Intravenous To OR  . DISCONTD: potassium chloride  80 mEq Other To OR  .  DISCONTD: sodium chloride  3 mL Intravenous Q12H  . DISCONTD: tranexamic acid  2 mg/kg Intracatheter To OR  . DISCONTD: tranexamic acid (CYKLOKAPRON) infusion ([facility])  1.5 mg/kg/hr Intravenous To OR    Physical Exam:   Sedated on vent; beginning to wake up and follows commands No wheezing RRR; no rub; A paced at 80 Abd soft; BS+ Trace edema   Rate: A paced at 80   Lab Results:    Basename 04/06/12 1419 04/06/12 1259 04/06/12 0500 04/04/12 0827  NA 142 142 -- --  K 3.7 4.3 -- --  CL -- -- 103 104  CO2 -- -- 25 21  GLUCOSE 121* 151* -- --  BUN -- -- 18 28*  CREATININE -- -- 0.94 0.85   No results found for this basename: TROPONINI:2,CK,MB:2 in the last 72 hours Hepatic Function Panel  Basename 04/04/12 0827  PROT 6.8  ALBUMIN 3.7  AST 16  ALT 24  ALKPHOS 54  BILITOT 0.4  BILIDIR <0.1  IBILI NOT CALCULATED      Basename 04/06/12 1414  INR 1.30    Lipid Panel     Component Value Date/Time   CHOL 147 04/04/2012 0827   TRIG 285* 04/04/2012 0827   HDL 20* 04/04/2012 0827   CHOLHDL 7.4 04/04/2012 0827   VLDL 57* 04/04/2012 0827   LDLCALC 70 04/04/2012 0827     Imaging:  Dg Chest 2 View  04/05/2012  *RADIOLOGY REPORT*  Clinical Data: Preoperative films.  Patient for CABG  CHEST - 2 VIEW  Comparison: PA and lateral chest 09/09/2008.  Findings: Lungs are clear.  Heart size is normal.  No pneumothorax or pleural fluid.  IMPRESSION: No acute disease.   Original Report Authenticated By: Bernadene Bell. D'ALESSIO, M.D.    Dg Chest Portable 1 View  04/06/2012  *RADIOLOGY REPORT*  Clinical Data: Postop CABG.  PORTABLE CHEST - 1 VIEW 04/06/2012 1440 hours:  Comparison: Two-view chest x-ray yesterday and 09/09/2008.  Findings: Sternotomy for CABG.  Endotracheal tube tip in satisfactory position projecting approximately 5 cm above the carina.  Swan-Ganz catheter tip projects over the right main pulmonary artery.  Left chest tubes in place with no pneumothorax. Cardiac silhouette upper normal in size to slightly enlarged. Pulmonary vascularity normal.  Dense atelectasis in the left lower lobe.  Lungs otherwise clear.  IMPRESSION: Support apparatus satisfactory.  No pneumothorax.  Left lower lobe atelectasis post CABG.   Original Report Authenticated By: Arnell Sieving, M.D.       Assessment/Plan:   Principal Problem:  *Unstable angina pectoris - resting chest pain with dynamic Anterior TWI Active Problems:  Diabetes mellitus type 2 in obese -- on oral medication, but non-compliant  Hypertension  CAD (coronary artery disease), native coronary artery - Proximal to Mid LAD 80% involving D1 & D2   Doing well s/p CABG x 3 today. On 5 of Neo and NTG (radial artery graft).    Lennette Bihari, MD, [facility] 04/06/2012, 4:08 PM

## 2012-04-06 NOTE — Anesthesia Postprocedure Evaluation (Signed)
Anesthesia Post Note  Patient: Gregory Reese  Procedure(s) Performed: Procedure(s) (LRB): CORONARY ARTERY BYPASS GRAFTING (CABG) (N/A) RADIAL ARTERY HARVEST (Left) RADIAL ARTERY HARVEST (Left)  Anesthesia type: General  Patient location: ICU  Post pain: Pain level controlled  Post assessment: Post-op Vital signs reviewed  Last Vitals:  Filed Vitals:   04/06/12 1615  BP:   Pulse: 80  Temp: 37.5 C  Resp: 17    Post vital signs: stable  Level of consciousness: Patient remains intubated per anesthesia plan  Complications: No apparent anesthesia complications

## 2012-04-07 ENCOUNTER — Inpatient Hospital Stay (HOSPITAL_COMMUNITY): Payer: BC Managed Care – PPO

## 2012-04-07 LAB — POCT I-STAT, CHEM 8
Calcium, Ion: 1.15 mmol/L (ref 1.12–1.23)
Creatinine, Ser: 1.1 mg/dL (ref 0.50–1.35)
Glucose, Bld: 240 mg/dL — ABNORMAL HIGH (ref 70–99)
HCT: 33 % — ABNORMAL LOW (ref 39.0–52.0)
Hemoglobin: 11.2 g/dL — ABNORMAL LOW (ref 13.0–17.0)
Potassium: 4.2 mEq/L (ref 3.5–5.1)

## 2012-04-07 LAB — GLUCOSE, CAPILLARY
Glucose-Capillary: 111 mg/dL — ABNORMAL HIGH (ref 70–99)
Glucose-Capillary: 113 mg/dL — ABNORMAL HIGH (ref 70–99)
Glucose-Capillary: 117 mg/dL — ABNORMAL HIGH (ref 70–99)
Glucose-Capillary: 137 mg/dL — ABNORMAL HIGH (ref 70–99)

## 2012-04-07 LAB — CBC
HCT: 32.4 % — ABNORMAL LOW (ref 39.0–52.0)
MCV: 83.3 fL (ref 78.0–100.0)
Platelets: 207 10*3/uL (ref 150–400)
RBC: 3.89 MIL/uL — ABNORMAL LOW (ref 4.22–5.81)
RDW: 13.4 % (ref 11.5–15.5)
RDW: 13.8 % (ref 11.5–15.5)
WBC: 15.5 10*3/uL — ABNORMAL HIGH (ref 4.0–10.5)
WBC: 14.5 10*3/uL — ABNORMAL HIGH (ref 4.0–10.5)

## 2012-04-07 LAB — BASIC METABOLIC PANEL
Chloride: 107 mEq/L (ref 96–112)
GFR calc Af Amer: >90 mL/min (ref >90)
Potassium: 3.7 mEq/L (ref 3.5–5.1)

## 2012-04-07 LAB — CREATININE, SERUM: GFR calc Af Amer: >90 mL/min (ref >90)

## 2012-04-07 LAB — MAGNESIUM: Magnesium: 2.1 mg/dL (ref 1.5–2.5)

## 2012-04-07 MED ORDER — METOPROLOL TARTRATE 25 MG PO TABS
25.0000 mg | ORAL_TABLET | Freq: Two times a day (BID) | ORAL | Status: DC
  Administered 2012-04-07 – 2012-04-08 (×3): 25 mg via ORAL
  Filled 2012-04-07 (×4): qty 1

## 2012-04-07 MED ORDER — AMIODARONE HCL IN DEXTROSE 360-4.14 MG/200ML-% IV SOLN
60.0000 mg/h | INTRAVENOUS | Status: AC
  Administered 2012-04-07 (×2): 60 mg/h via INTRAVENOUS
  Filled 2012-04-07: qty 200

## 2012-04-07 MED ORDER — POTASSIUM CHLORIDE CRYS ER 20 MEQ PO TBCR
40.0000 meq | EXTENDED_RELEASE_TABLET | Freq: Two times a day (BID) | ORAL | Status: AC
  Administered 2012-04-07 (×2): 40 meq via ORAL
  Filled 2012-04-07: qty 2

## 2012-04-07 MED ORDER — AMIODARONE HCL IN DEXTROSE 360-4.14 MG/200ML-% IV SOLN
30.0000 mg/h | INTRAVENOUS | Status: DC
  Administered 2012-04-08 (×3): 30 mg/h via INTRAVENOUS
  Filled 2012-04-07 (×5): qty 200

## 2012-04-07 MED ORDER — AMIODARONE HCL IN DEXTROSE 360-4.14 MG/200ML-% IV SOLN
INTRAVENOUS | Status: AC
  Filled 2012-04-07: qty 200

## 2012-04-07 MED ORDER — INSULIN GLARGINE 100 UNIT/ML ~~LOC~~ SOLN
10.0000 [IU] | Freq: Every day | SUBCUTANEOUS | Status: DC
  Administered 2012-04-07: 10 [IU] via SUBCUTANEOUS

## 2012-04-07 MED ORDER — POTASSIUM CHLORIDE 10 MEQ/50ML IV SOLN
INTRAVENOUS | Status: AC
  Administered 2012-04-07: 10 meq via INTRAVENOUS
  Filled 2012-04-07: qty 50

## 2012-04-07 MED ORDER — AMIODARONE LOAD VIA INFUSION
150.0000 mg | Freq: Once | INTRAVENOUS | Status: AC
  Administered 2012-04-07: 150 mg via INTRAVENOUS
  Filled 2012-04-07: qty 83.34

## 2012-04-07 MED ORDER — METOPROLOL TARTRATE 25 MG/10 ML ORAL SUSPENSION
12.5000 mg | Freq: Two times a day (BID) | ORAL | Status: DC
  Filled 2012-04-07 (×4): qty 5

## 2012-04-07 MED ORDER — POTASSIUM CHLORIDE 10 MEQ/50ML IV SOLN
10.0000 meq | INTRAVENOUS | Status: AC
  Administered 2012-04-07 (×3): 10 meq via INTRAVENOUS

## 2012-04-07 MED ORDER — INSULIN ASPART 100 UNIT/ML ~~LOC~~ SOLN: 0.0000 [IU] | SUBCUTANEOUS | Status: DC

## 2012-04-07 MED ORDER — INSULIN ASPART 100 UNIT/ML ~~LOC~~ SOLN
0.0000 [IU] | SUBCUTANEOUS | Status: DC
  Administered 2012-04-07: 8 [IU] via SUBCUTANEOUS
  Administered 2012-04-07: 2 [IU] via SUBCUTANEOUS
  Administered 2012-04-07: 4 [IU] via SUBCUTANEOUS
  Administered 2012-04-08: 2 [IU] via SUBCUTANEOUS
  Administered 2012-04-08: 12 [IU] via SUBCUTANEOUS
  Administered 2012-04-08: 4 [IU] via SUBCUTANEOUS
  Administered 2012-04-08 (×2): 2 [IU] via SUBCUTANEOUS
  Administered 2012-04-08: 8 [IU] via SUBCUTANEOUS
  Administered 2012-04-09: 4 [IU] via SUBCUTANEOUS
  Administered 2012-04-09: 2 [IU] via SUBCUTANEOUS
  Administered 2012-04-09 (×2): 4 [IU] via SUBCUTANEOUS
  Administered 2012-04-10 (×2): 2 [IU] via SUBCUTANEOUS

## 2012-04-07 MED ORDER — FUROSEMIDE 10 MG/ML IJ SOLN
40.0000 mg | Freq: Two times a day (BID) | INTRAMUSCULAR | Status: AC
  Administered 2012-04-07 (×2): 40 mg via INTRAVENOUS

## 2012-04-07 MED ORDER — ENOXAPARIN SODIUM 40 MG/0.4ML ~~LOC~~ SOLN
40.0000 mg | SUBCUTANEOUS | Status: DC
  Administered 2012-04-07 – 2012-04-11 (×5): 40 mg via SUBCUTANEOUS
  Filled 2012-04-07 (×7): qty 0.4

## 2012-04-07 MED ORDER — INSULIN ASPART 100 UNIT/ML ~~LOC~~ SOLN
0.0000 [IU] | SUBCUTANEOUS | Status: AC
  Administered 2012-04-07 (×2): 2 [IU] via SUBCUTANEOUS

## 2012-04-07 NOTE — Progress Notes (Signed)
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  Objective:  Temp:  [97.5 F (36.4 C)-101.5 F (38.6 C)] 98.6 F (37 C) (08/31 0900) Pulse Rate:  [79-102] 89  (08/31 0900) Resp:  [11-32] 26  (08/31 0900) BP: (85-129)/(61-80) 118/61 mmHg (08/31 0900) SpO2:  [95 %-100 %] 96 % (08/31 0900) Arterial Line BP: (94-170)/(54-74) 135/59 mmHg (08/31 0900) FiO2 (%):  [40 %-50 %] 40 % (08/30 1745) Weight:  [112.2 kg (247 lb 5.7 oz)] 112.2 kg (247 lb 5.7 oz) (08/31 0600) Weight change:   Intake/Output from previous day: 08/30 0701 - 08/31 0700 In: 3979.3 [P.O.:480; I.V.:2549.3; Blood:350; IV Piggyback:600] Out: 5415 [Urine:4225; Blood:700; Chest Tube:490]  Intake/Output from this shift: Total I/O In: 111.5 [I.V.:61.5; IV Piggyback:50] Out: 50 [Urine:50]  Medications: Current Facility-Administered Medications  Medication Dose Route Frequency Provider Last Rate Last Dose  . 0.45 % sodium chloride infusion   Intravenous Continuous Ardelle Balls, PA 20 mL/hr at 04/06/12 1415    . 0.9 %  sodium chloride infusion   Intravenous Continuous Ardelle Balls, PA 20 mL/hr at 04/06/12 1415    . 0.9 %  sodium chloride infusion  250 mL Intravenous Continuous Ardelle Balls, PA      . acetaminophen (TYLENOL) solution 650 mg  650 mg Per Tube NOW Donielle Margaretann Loveless, PA       Or  . acetaminophen (TYLENOL) suppository 650 mg  650 mg Rectal NOW Ardelle Balls, PA   650 mg at 04/06/12 1433  . acetaminophen (TYLENOL) tablet 1,000 mg  1,000 mg Oral Q6H Ardelle Balls, PA   1,000 mg at 04/07/12 0601   Or  . acetaminophen (TYLENOL) solution 975 mg  975 mg Per Tube Q6H Ardelle Balls, PA   975 mg at 04/06/12 1733  . albumin human 5 % solution 250 mL  250 mL Intravenous Q15 min PRN Ardelle Balls, PA      . aspirin EC tablet 325 mg  325 mg Oral Daily  Ardelle Balls, PA   325 mg at 04/07/12 0945   Or  . aspirin chewable tablet 324 mg  324 mg Per Tube Daily Donielle Margaretann Loveless, PA      . bisacodyl (DULCOLAX) EC tablet 10 mg  10 mg Oral Daily Ardelle Balls, PA   10 mg at 04/07/12 0913   Or  . bisacodyl (DULCOLAX) suppository 10 mg  10 mg Rectal Daily Ardelle Balls, PA      . dexmedetomidine (PRECEDEX) 200 mcg / 50 mL infusion  0.1-0.7 mcg/kg/hr Intravenous Continuous Ardelle Balls, PA   0.1 mcg/kg/hr at 04/06/12 1600  . docusate sodium (COLACE) capsule 200 mg  200 mg Oral Daily Ardelle Balls, PA   200 mg at 04/07/12 0913  . enoxaparin (LOVENOX) injection 40 mg  40 mg Subcutaneous Q24H Alleen Borne, MD   40 mg at 04/07/12 0946  . famotidine (PEPCID) IVPB 20 mg  20 mg Intravenous Q12H Ardelle Balls, PA   20 mg at 04/06/12 2204  . fenofibrate tablet 160 mg  160 mg Oral Daily Lennette Bihari, MD   160 mg at 04/05/12 1200  . furosemide (LASIX) injection 40 mg  40 mg Intravenous BID Alleen Borne, MD   40 mg at 04/07/12 0913  . insulin aspart (novoLOG) injection 0-24 Units  0-24 Units Subcutaneous Q2H Alleen Borne, MD   2 Units at 04/07/12 [phone]  . insulin aspart (novoLOG) injection 0-24 Units  0-24 Units Subcutaneous Q4H Alleen Borne, MD      . insulin regular (NOVOLIN R,HUMULIN R) 1 Units/mL in sodium chloride 0.9 % 100 mL infusion   Intravenous Continuous Ardelle Balls, PA 0.9 mL/hr at 04/07/12 0600    . insulin regular bolus via infusion 0-10 Units  0-10 Units Intravenous TID WC Donielle Margaretann Loveless, PA      . isosorbide mononitrate (IMDUR) 24 hr tablet 15 mg  15 mg Oral Daily Ardelle Balls, PA   15 mg at 04/07/12 0912  . ketorolac (TORADOL) 15 MG/ML injection 15 mg  15 mg Intravenous Q6H PRN Alleen Borne, MD   15 mg at 04/07/12 0359  . lactated ringers infusion 500 mL  500 mL Intravenous Once PRN Ardelle Balls, PA      . lactated ringers infusion   Intravenous Continuous  Donielle Margaretann Loveless, PA      . magnesium sulfate IVPB 4 g 100 mL  4 g Intravenous Once Ardelle Balls, PA   4 g at 04/06/12 1433  . metoprolol (LOPRESSOR) injection 2.5-5 mg  2.5-5 mg Intravenous Q2H PRN Ardelle Balls, PA      . metoprolol tartrate (LOPRESSOR) tablet 25 mg  25 mg Oral BID Alleen Borne, MD   25 mg at 04/07/12 0913   Or  . metoprolol tartrate (LOPRESSOR) 25 mg/10 mL oral suspension 12.5 mg  12.5 mg Per Tube BID Alleen Borne, MD      . midazolam (VERSED) injection 2 mg  2 mg Intravenous Q1H PRN Ardelle Balls, PA      . morphine 2 MG/ML injection 1-4 mg  1-4 mg Intravenous Q1H PRN Ardelle Balls, PA      . morphine 2 MG/ML injection 2-5 mg  2-5 mg Intravenous Q1H PRN Ardelle Balls, PA   4 mg at 04/07/12 0912  . moxifloxacin (AVELOX) IVPB 400 mg  400 mg Intravenous Q24H Ardelle Balls, PA   400 mg at 04/07/12 0558  . nitroGLYCERIN 0.2 mg/mL in dextrose 5 % infusion  2-200 mcg/min Intravenous To OR Marykay Lex, MD   5 mcg/min at 04/06/12 1305  . nitroGLYCERIN 0.2 mg/mL in dextrose 5 % infusion  0-100 mcg/min Intravenous Continuous Ardelle Balls, PA 1.5 mL/hr at 04/07/12 0600 5 mcg/min at 04/07/12 0600  . ondansetron (ZOFRAN) injection 4 mg  4 mg Intravenous Q6H PRN Ardelle Balls, PA      . oxyCODONE (Oxy IR/ROXICODONE) immediate release tablet 5-10 mg  5-10 mg Oral Q3H PRN Ardelle Balls, PA   10 mg at 04/07/12 0735  . pantoprazole (PROTONIX) EC tablet 40 mg  40 mg Oral Q1200 Ardelle Balls, PA      . phenylephrine (NEO-SYNEPHRINE) 20,000 mcg in dextrose 5 % 250 mL infusion  0-100 mcg/min Intravenous Continuous Ardelle Balls, PA   10 mcg/min at 04/06/12 1700  . potassium chloride 10 mEq in 50 mL *CENTRAL LINE* IVPB  10 mEq Intravenous Q1 Hr x 3 Ardelle Balls, PA   10 mEq at 04/06/12 1727  . potassium chloride 10 mEq in 50 mL *CENTRAL LINE* IVPB  10 mEq Intravenous Q1 Hr x 3 Alleen Borne, MD   10 mEq  at 04/07/12 0825  . potassium chloride SA (K-DUR,KLOR-CON) CR tablet 40 mEq  40 mEq Oral BID Alleen Borne, MD   40 mEq at 04/07/12 0913  . sodium chloride 0.9 % injection 3 mL  3 mL Intravenous Q12H Ardelle Balls, PA   3 mL at 04/07/12 0914  . sodium chloride 0.9 % injection 3 mL  3 mL Intravenous PRN Ardelle Balls, PA      . vancomycin (VANCOCIN) IVPB 1000 mg/200 mL premix  1,000 mg Intravenous Once Ardelle Balls, PA   1,000 mg at 04/06/12 1959  . DISCONTD: 0.9 %  sodium chloride infusion  250 mL Intravenous PRN Marykay Lex, MD      . DISCONTD: 0.9 %  sodium chloride infusion   Intravenous Continuous Lennette Bihari, MD 50 mL/hr at 04/06/12 0200    . DISCONTD: 0.9 % irrigation (POUR BTL)    PRN Alleen Borne, MD   1,000 mL at 04/06/12 0947  . DISCONTD: acetaminophen (TYLENOL) tablet 650 mg  650 mg Oral Q4H PRN Marykay Lex, MD      . DISCONTD: ALPRAZolam Prudy Feeler) tablet 0.25-0.5 mg  0.25-0.5 mg Oral Q4H PRN Alleen Borne, MD      . DISCONTD: aspirin EC tablet 81 mg  81 mg Oral Daily Marykay Lex, MD   81 mg at 04/05/12 1008  . DISCONTD: atorvastatin (LIPITOR) tablet 80 mg  80 mg Oral q1800 Marykay Lex, MD   80 mg at 04/05/12 1801  . DISCONTD: dexmedetomidine (PRECEDEX) 400 mcg / 100 mL infusion  0.1-0.7 mcg/kg/hr Intravenous To OR Alleen Borne, MD      . DISCONTD: DOPamine (INTROPIN) 800 mg in dextrose 5 % 250 mL infusion  2-20 mcg/kg/min Intravenous To OR Marykay Lex, MD      . DISCONTD: EPINEPHrine (ADRENALIN) 4,000 mcg in dextrose 5 % 250 mL infusion  0.5-20 mcg/min Intravenous To OR Marykay Lex, MD      . DISCONTD: going for heart surgery book   Does not apply Once Marykay Lex, MD      . DISCONTD: hemostatic agents    PRN Alleen Borne, MD   1 application at 04/06/12 [phone]  . DISCONTD: insulin aspart (novoLOG) injection 0-24 Units  0-24 Units Subcutaneous Q4H Alleen Borne, MD      . DISCONTD: insulin aspart (novoLOG) injection 0-9 Units  0-9  Units Subcutaneous TID WC Wilburt Finlay, PA   2 Units at 04/05/12 1800  . DISCONTD: linagliptin (TRADJENTA) tablet 5 mg  5 mg Oral Q breakfast Marykay Lex, MD   5 mg at 04/05/12 1008  . DISCONTD: lisinopril (PRINIVIL,ZESTRIL) tablet 40 mg  40 mg Oral Daily Marykay Lex, MD   40 mg at 04/05/12 1008  . DISCONTD: magnesium sulfate (IV Push/IM) injection 40 mEq  40 mEq Other To OR Marykay Lex, MD      . DISCONTD: metoprolol tartrate (LOPRESSOR) 25 mg/10 mL oral suspension 12.5 mg  12.5 mg Per Tube BID Ardelle Balls, PA   12.5 mg at 04/06/12 2339  . DISCONTD: metoprolol tartrate (LOPRESSOR) tablet 12.5 mg  12.5 mg Oral BID Lennette Bihari, MD   12.5 mg at 04/05/12 2230  . DISCONTD: metoprolol tartrate (LOPRESSOR) tablet 12.5 mg  12.5 mg Oral Once Alleen Borne, MD      . DISCONTD: metoprolol tartrate (LOPRESSOR) tablet 12.5 mg  12.5 mg Oral BID Ardelle Balls, PA      . DISCONTD: morphine 2 MG/ML injection 2 mg  2 mg Intravenous Q1H PRN Marykay Lex, MD      . DISCONTD: ondansetron [facility]) injection 4 mg  4 mg Intravenous Q6H PRN Marykay Lex, MD      . DISCONTD: pantoprazole (PROTONIX) EC tablet 40 mg  40 mg Oral Q1200 Marykay Lex, MD   40 mg at 04/05/12 1253  . DISCONTD: phenylephrine (NEO-SYNEPHRINE) 20,000 mcg in dextrose 5 % 250 mL infusion  30-200 mcg/min Intravenous To OR Marykay Lex, MD 3.8 mL/hr at 04/06/12 1347 5 mcg/min at 04/06/12 1347  . DISCONTD: potassium chloride injection 80 mEq  80 mEq Other To OR Marykay Lex, MD      . DISCONTD: sodium chloride 0.9 % injection 3 mL  3 mL Intravenous Q12H Marykay Lex, MD   3 mL at 04/05/12 1000  . DISCONTD: sodium chloride 0.9 % injection 3 mL  3 mL Intravenous PRN Marykay Lex, MD      . DISCONTD: thrombin spray    PRN Alleen Borne, MD   20,000 Units at 04/06/12 0940  . DISCONTD: tranexamic acid (CYKLOKAPRON) pump prime solution 224 mg  2 mg/kg Intracatheter To OR Marykay Lex, MD      . DISCONTD:  tranexamic acid (CYKLOLAPRON) 2,500 mg in sodium chloride 0.9 % 250 mL infusion  1.5 mg/kg/hr Intravenous To OR Alleen Borne, MD       Facility-Administered Medications Ordered in Other Encounters  Medication Dose Route Frequency Provider Last Rate Last Dose  . DISCONTD: ePHEDrine injection    PRN Hessie Dibble, CRNA   5 mg at 04/06/12 0920  . DISCONTD: fentaNYL (SUBLIMAZE) injection    PRN Hessie Dibble, CRNA   150 mcg at 04/06/12 1318  . DISCONTD: heparin injection    PRN Hessie Dibble, CRNA   10,000 Units at 04/06/12 1038  . DISCONTD: lactated ringers infusion    Continuous PRN Hessie Dibble, CRNA      . DISCONTD: lactated ringers infusion    Continuous PRN Hessie Dibble, CRNA      . DISCONTD: lactated ringers infusion    Continuous PRN Hessie Dibble, CRNA      . DISCONTD: midazolam (VERSED) 5 MG/5ML injection    PRN Hessie Dibble, CRNA   2 mg at 04/06/12 1307  . DISCONTD: propofol (DIPRIVAN) 10 mg/ml infusion    PRN Hessie Dibble, CRNA   50 mg at 04/06/12 0740  . DISCONTD: protamine injection    PRN Hessie Dibble, CRNA   40 mg at 04/06/12 1253  . DISCONTD: rocuronium (ZEMURON) injection    PRN Hessie Dibble, CRNA   50 mg at 04/06/12 1227  . DISCONTD: succinylcholine (ANECTINE) injection    PRN Hessie Dibble, CRNA   100 mg at 04/06/12 0735  . DISCONTD: vecuronium (NORCURON) injection    PRN Hessie Dibble, CRNA   10 mg at 04/06/12 1045    Physical Exam: General appearance: awake, NAD Neck: supple, no JVD Lungs: diminshed at the bases Heart: RRR, s1/s2, no M/R/g's Abdomen: soft, non-tender Extremities: trace edema Pulses: 2+ pulses Skin exam: pale, dry, wam Neurologic: A&Ox3, follows commands  Lab Results: Results for orders placed during the hospital encounter of 04/04/12 (from the past 48 hour(s))  GLUCOSE, CAPILLARY     Status: Abnormal   Collection Time   04/05/12 10:13 AM      Component Value  Range Comment   Glucose-Capillary 149 (*) 70 -  99 mg/dL   GLUCOSE, CAPILLARY     Status: Abnormal   Collection Time   04/05/12  1:21 PM      Component Value Range Comment   Glucose-Capillary 197 (*) 70 - 99 mg/dL   GLUCOSE, CAPILLARY     Status: Abnormal   Collection Time   04/05/12  5:40 PM      Component Value Range Comment   Glucose-Capillary 167 (*) 70 - 99 mg/dL   TYPE AND SCREEN     Status: Normal   Collection Time   04/05/12  7:45 PM      Component Value Range Comment   ABO/RH(D) A POS      Antibody Screen NEG      Sample Expiration 04/08/2012     ABO/RH     Status: Normal   Collection Time   04/05/12  7:45 PM      Component Value Range Comment   ABO/RH(D) A POS     URINALYSIS, ROUTINE W REFLEX MICROSCOPIC     Status: Abnormal   Collection Time   04/05/12  8:06 PM      Component Value Range Comment   Color, Urine YELLOW  YELLOW    APPearance CLEAR  CLEAR    Specific Gravity, Urine 1.018  1.005 - 1.030    pH 5.0  5.0 - 8.0    Glucose, UA 250 (*) NEGATIVE mg/dL    Hgb urine dipstick NEGATIVE  NEGATIVE    Bilirubin Urine NEGATIVE  NEGATIVE    Ketones, ur NEGATIVE  NEGATIVE mg/dL    Protein, ur NEGATIVE  NEGATIVE mg/dL    Urobilinogen, UA 0.2  0.0 - 1.0 mg/dL    Nitrite NEGATIVE  NEGATIVE    Leukocytes, UA SMALL (*) NEGATIVE   SURGICAL PCR SCREEN     Status: Normal   Collection Time   04/05/12  8:06 PM      Component Value Range Comment   MRSA, PCR NEGATIVE  NEGATIVE    Staphylococcus aureus NEGATIVE  NEGATIVE   URINE MICROSCOPIC-ADD ON     Status: Abnormal   Collection Time   04/05/12  8:06 PM      Component Value Range Comment   Squamous Epithelial / LPF RARE  RARE    WBC, UA 3-6  <3 WBC/hpf    RBC / HPF 0-2  <3 RBC/hpf    Crystals URIC ACID CRYSTALS (*) NEGATIVE   GLUCOSE, CAPILLARY     Status: Abnormal   Collection Time   04/05/12  9:37 PM      Component Value Range Comment   Glucose-Capillary 235 (*) 70 - 99 mg/dL    Comment 1 Documented in Chart       Comment 2 Notify RN     BLOOD GAS, ARTERIAL     Status: Abnormal   Collection Time   04/06/12  3:25 AM      Component Value Range Comment   FIO2 0.21      Delivery systems ROOM AIR      pH, Arterial 7.382  7.350 - 7.450    pCO2 arterial 40.8  35.0 - 45.0 mmHg    pO2, Arterial 56.8 (*) 80.0 - 100.0 mmHg    Bicarbonate 23.7  20.0 - 24.0 mEq/L    TCO2 25.0  0 - 100 mmol/L    Acid-base deficit 0.7  0.0 - 2.0 mmol/L    O2 Saturation 89.0      Patient temperature 98.6      Collection  site RIGHT RADIAL      Drawn by COLLECTED BY RT      Sample type ARTERIAL DRAW      Allens test (pass/fail) PASS  PASS   APTT     Status: Normal   Collection Time   04/06/12  3:30 AM      Component Value Range Comment   aPTT 26  24 - 37 seconds   CBC     Status: Normal   Collection Time   04/06/12  5:00 AM      Component Value Range Comment   WBC 7.3  4.0 - 10.5 K/uL    RBC 4.94  4.22 - 5.81 MIL/uL    Hemoglobin 14.1  13.0 - 17.0 g/dL    HCT 16.1  09.6 - 04.5 %    MCV 82.4  78.0 - 100.0 fL    MCH 28.5  26.0 - 34.0 pg    MCHC 34.6  30.0 - 36.0 g/dL    RDW 40.9  81.1 - 91.4 %    Platelets 206  150 - 400 K/uL   BASIC METABOLIC PANEL     Status: Abnormal   Collection Time   04/06/12  5:00 AM      Component Value Range Comment   Sodium 138  135 - 145 mEq/L    Potassium 3.6  3.5 - 5.1 mEq/L    Chloride 103  96 - 112 mEq/L    CO2 25  19 - 32 mEq/L    Glucose, Bld 187 (*) 70 - 99 mg/dL    BUN 18  6 - 23 mg/dL    Creatinine, Ser 7.82  0.50 - 1.35 mg/dL    Calcium 9.3  8.4 - 95.6 mg/dL    GFR calc non Af Amer >90  >90 mL/min    GFR calc Af Amer >90  >90 mL/min   POCT I-STAT 4, (NA,K, GLUC, HGB,HCT)     Status: Abnormal   Collection Time   04/06/12  7:59 AM      Component Value Range Comment   Sodium 141  135 - 145 mEq/L    Potassium 4.3  3.5 - 5.1 mEq/L    Glucose, Bld 183 (*) 70 - 99 mg/dL    HCT 21.3 (*) 08.6 - 52.0 %    Hemoglobin 12.9 (*) 13.0 - 17.0 g/dL   POCT I-STAT 3, BLOOD GAS (G3+)      Status: Abnormal   Collection Time   04/06/12  8:03 AM      Component Value Range Comment   pH, Arterial 7.317 (*) 7.350 - 7.450    pCO2 arterial 47.8 (*) 35.0 - 45.0 mmHg    pO2, Arterial 87.0  80.0 - 100.0 mmHg    Bicarbonate 24.4 (*) 20.0 - 24.0 mEq/L    TCO2 26  0 - 100 mmol/L    O2 Saturation 96.0      Acid-base deficit 2.0  0.0 - 2.0 mmol/L    Sample type ARTERIAL     POCT I-STAT GLUCOSE     Status: Abnormal   Collection Time   04/06/12  9:18 AM      Component Value Range Comment   Operator id [id number]      Glucose, Bld 175 (*) 70 - 99 mg/dL   POCT I-STAT GLUCOSE     Status: Abnormal   Collection Time   04/06/12 10:37 AM      Component Value Range Comment   Operator id [id number]  Glucose, Bld 139 (*) 70 - 99 mg/dL   POCT I-STAT 4, (NA,K, GLUC, HGB,HCT)     Status: Abnormal   Collection Time   04/06/12 10:46 AM      Component Value Range Comment   Sodium 141  135 - 145 mEq/L    Potassium 4.1  3.5 - 5.1 mEq/L    Glucose, Bld 127 (*) 70 - 99 mg/dL    HCT 16.1 (*) 09.6 - 52.0 %    Hemoglobin 11.9 (*) 13.0 - 17.0 g/dL   POCT I-STAT 4, (NA,K, GLUC, HGB,HCT)     Status: Abnormal   Collection Time   04/06/12 11:16 AM      Component Value Range Comment   Sodium 141  135 - 145 mEq/L    Potassium 4.0  3.5 - 5.1 mEq/L    Glucose, Bld 101 (*) 70 - 99 mg/dL    HCT 04.5 (*) 40.9 - 52.0 %    Hemoglobin 9.5 (*) 13.0 - 17.0 g/dL   POCT I-STAT 3, BLOOD GAS (G3+)     Status: Abnormal   Collection Time   04/06/12 11:22 AM      Component Value Range Comment   pH, Arterial 7.366  7.350 - 7.450    pCO2 arterial 40.7  35.0 - 45.0 mmHg    pO2, Arterial 283.0 (*) 80.0 - 100.0 mmHg    Bicarbonate 23.3  20.0 - 24.0 mEq/L    TCO2 25  0 - 100 mmol/L    O2 Saturation 100.0      Acid-base deficit 2.0  0.0 - 2.0 mmol/L    Sample type ARTERIAL     POCT ACTIVATED CLOTTING TIME     Status: Normal   Collection Time   04/06/12 11:44 AM      Component Value Range Comment   Activated Clotting Time 359      HEMOGLOBIN AND HEMATOCRIT, BLOOD     Status: Abnormal   Collection Time   04/06/12 12:08 PM      Component Value Range Comment   Hemoglobin 11.1 (*) 13.0 - 17.0 g/dL DELTA CHECK NOTED   HCT 31.4 (*) 39.0 - 52.0 %   PLATELET COUNT     Status: Normal   Collection Time   04/06/12 12:08 PM      Component Value Range Comment   Platelets 192  150 - 400 K/uL   POCT I-STAT 4, (NA,K, GLUC, HGB,HCT)     Status: Abnormal   Collection Time   04/06/12 12:09 PM      Component Value Range Comment   Sodium 141  135 - 145 mEq/L    Potassium 4.7  3.5 - 5.1 mEq/L    Glucose, Bld 126 (*) 70 - 99 mg/dL    HCT 81.1 (*) 91.4 - 52.0 %    Hemoglobin 10.9 (*) 13.0 - 17.0 g/dL   POCT I-STAT 4, (NA,K, GLUC, HGB,HCT)     Status: Abnormal   Collection Time   04/06/12 12:59 PM      Component Value Range Comment   Sodium 142  135 - 145 mEq/L    Potassium 4.3  3.5 - 5.1 mEq/L    Glucose, Bld 151 (*) 70 - 99 mg/dL    HCT 78.2 (*) 95.6 - 52.0 %    Hemoglobin 10.5 (*) 13.0 - 17.0 g/dL   POCT I-STAT 3, BLOOD GAS (G3+)     Status: Abnormal   Collection Time   04/06/12  1:05 PM  Component Value Range Comment   pH, Arterial 7.370  7.350 - 7.450    pCO2 arterial 38.7  35.0 - 45.0 mmHg    pO2, Arterial 71.0 (*) 80.0 - 100.0 mmHg    Bicarbonate 22.4  20.0 - 24.0 mEq/L    TCO2 24  0 - 100 mmol/L    O2 Saturation 94.0      Acid-base deficit 3.0 (*) 0.0 - 2.0 mmol/L    Sample type ARTERIAL     CBC     Status: Abnormal   Collection Time   04/06/12  2:14 PM      Component Value Range Comment   WBC 12.4 (*) 4.0 - 10.5 K/uL    RBC 4.12 (*) 4.22 - 5.81 MIL/uL    Hemoglobin 11.9 (*) 13.0 - 17.0 g/dL    HCT 16.1 (*) 09.6 - 52.0 %    MCV 81.3  78.0 - 100.0 fL    MCH 28.9  26.0 - 34.0 pg    MCHC 35.5  30.0 - 36.0 g/dL    RDW 04.5  40.9 - 81.1 %    Platelets 159  150 - 400 K/uL   PROTIME-INR     Status: Abnormal   Collection Time   04/06/12  2:14 PM      Component Value Range Comment   Prothrombin Time 16.4 (*)  11.6 - 15.2 seconds    INR 1.30  0.00 - 1.49   APTT     Status: Normal   Collection Time   04/06/12  2:14 PM      Component Value Range Comment   aPTT 29  24 - 37 seconds   POCT I-STAT 4, (NA,K, GLUC, HGB,HCT)     Status: Abnormal   Collection Time   04/06/12  2:19 PM      Component Value Range Comment   Sodium 142  135 - 145 mEq/L    Potassium 3.7  3.5 - 5.1 mEq/L    Glucose, Bld 121 (*) 70 - 99 mg/dL    HCT 91.4 (*) 78.2 - 52.0 %    Hemoglobin 11.2 (*) 13.0 - 17.0 g/dL   POCT I-STAT 3, BLOOD GAS (G3+)     Status: Abnormal   Collection Time   04/06/12  2:23 PM      Component Value Range Comment   pH, Arterial 7.401  7.350 - 7.450    pCO2 arterial 37.0  35.0 - 45.0 mmHg    pO2, Arterial 69.0 (*) 80.0 - 100.0 mmHg    Bicarbonate 23.2  20.0 - 24.0 mEq/L    TCO2 24  0 - 100 mmol/L    O2 Saturation 94.0      Acid-base deficit 2.0  0.0 - 2.0 mmol/L    Patient temperature 36.4 C      Collection site ARTERIAL LINE      Drawn by Nurse      Sample type ARTERIAL     GLUCOSE, CAPILLARY     Status: Normal   Collection Time   04/06/12  3:28 PM      Component Value Range Comment   Glucose-Capillary 77  70 - 99 mg/dL   GLUCOSE, CAPILLARY     Status: Abnormal   Collection Time   04/06/12  4:26 PM      Component Value Range Comment   Glucose-Capillary 137 (*) 70 - 99 mg/dL   GLUCOSE, CAPILLARY     Status: Abnormal   Collection Time   04/06/12  5:23 PM  Component Value Range Comment   Glucose-Capillary 135 (*) 70 - 99 mg/dL   POCT I-STAT 3, BLOOD GAS (G3+)     Status: Normal   Collection Time   04/06/12  5:40 PM      Component Value Range Comment   pH, Arterial 7.354  7.350 - 7.450    pCO2 arterial 40.9  35.0 - 45.0 mmHg    pO2, Arterial 84.0  80.0 - 100.0 mmHg    Bicarbonate 22.4  20.0 - 24.0 mEq/L    TCO2 24  0 - 100 mmol/L    O2 Saturation 95.0      Acid-base deficit 2.0  0.0 - 2.0 mmol/L    Patient temperature 38.5 C      Collection site ARTERIAL LINE      Drawn by Nurse        Sample type ARTERIAL     GLUCOSE, CAPILLARY     Status: Abnormal   Collection Time   04/06/12  6:08 PM      Component Value Range Comment   Glucose-Capillary 143 (*) 70 - 99 mg/dL   GLUCOSE, CAPILLARY     Status: Abnormal   Collection Time   04/06/12  6:47 PM      Component Value Range Comment   Glucose-Capillary 137 (*) 70 - 99 mg/dL   POCT I-STAT 3, BLOOD GAS (G3+)     Status: Abnormal   Collection Time   04/06/12  6:49 PM      Component Value Range Comment   pH, Arterial 7.336 (*) 7.350 - 7.450    pCO2 arterial 42.4  35.0 - 45.0 mmHg    pO2, Arterial 72.0 (*) 80.0 - 100.0 mmHg    Bicarbonate 22.3  20.0 - 24.0 mEq/L    TCO2 23  0 - 100 mmol/L    O2 Saturation 92.0      Acid-base deficit 3.0 (*) 0.0 - 2.0 mmol/L    Patient temperature 38.5 C      Collection site ARTERIAL LINE      Drawn by Nurse      Sample type ARTERIAL     GLUCOSE, CAPILLARY     Status: Abnormal   Collection Time   04/06/12  7:55 PM      Component Value Range Comment   Glucose-Capillary 109 (*) 70 - 99 mg/dL    Comment 1 Notify RN     CBC     Status: Abnormal   Collection Time   04/06/12  8:15 PM      Component Value Range Comment   WBC 17.4 (*) 4.0 - 10.5 K/uL    RBC 4.60  4.22 - 5.81 MIL/uL    Hemoglobin 13.3  13.0 - 17.0 g/dL    HCT 16.1 (*) 09.6 - 52.0 %    MCV 81.5  78.0 - 100.0 fL    MCH 28.9  26.0 - 34.0 pg    MCHC 35.5  30.0 - 36.0 g/dL    RDW 04.5  40.9 - 81.1 %    Platelets 200  150 - 400 K/uL   MAGNESIUM     Status: Normal   Collection Time   04/06/12  8:15 PM      Component Value Range Comment   Magnesium 2.3  1.5 - 2.5 mg/dL   CREATININE, SERUM     Status: Normal   Collection Time   04/06/12  8:15 PM      Component Value Range Comment   Creatinine, Ser 0.78  0.50 - 1.35 mg/dL    GFR calc non Af Amer >90  >90 mL/min    GFR calc Af Amer >90  >90 mL/min   GLUCOSE, CAPILLARY     Status: Abnormal   Collection Time   04/06/12  8:59 PM      Component Value Range Comment    Glucose-Capillary 140 (*) 70 - 99 mg/dL    Comment 1 Notify RN      Comment 2 Glucose Stabilizer     GLUCOSE, CAPILLARY     Status: Abnormal   Collection Time   04/06/12  9:58 PM      Component Value Range Comment   Glucose-Capillary 132 (*) 70 - 99 mg/dL    Comment 1 Notify RN      Comment 2 Glucose Stabilizer     GLUCOSE, CAPILLARY     Status: Abnormal   Collection Time   04/06/12 10:55 PM      Component Value Range Comment   Glucose-Capillary 127 (*) 70 - 99 mg/dL    Comment 1 Notify RN      Comment 2 Glucose Stabilizer     GLUCOSE, CAPILLARY     Status: Abnormal   Collection Time   04/06/12 11:50 PM      Component Value Range Comment   Glucose-Capillary 111 (*) 70 - 99 mg/dL    Comment 1 Documented in Chart      Comment 2 Notify RN     GLUCOSE, CAPILLARY     Status: Abnormal   Collection Time   04/07/12 12:47 AM      Component Value Range Comment   Glucose-Capillary 113 (*) 70 - 99 mg/dL   GLUCOSE, CAPILLARY     Status: Abnormal   Collection Time   04/07/12  1:49 AM      Component Value Range Comment   Glucose-Capillary 123 (*) 70 - 99 mg/dL   GLUCOSE, CAPILLARY     Status: Abnormal   Collection Time   04/07/12  2:54 AM      Component Value Range Comment   Glucose-Capillary 111 (*) 70 - 99 mg/dL   GLUCOSE, CAPILLARY     Status: Abnormal   Collection Time   04/07/12  3:47 AM      Component Value Range Comment   Glucose-Capillary 116 (*) 70 - 99 mg/dL   CBC     Status: Abnormal   Collection Time   04/07/12  3:50 AM      Component Value Range Comment   WBC 15.5 (*) 4.0 - 10.5 K/uL    RBC 4.36  4.22 - 5.81 MIL/uL    Hemoglobin 12.3 (*) 13.0 - 17.0 g/dL    HCT 16.1 (*) 09.6 - 52.0 %    MCV 81.9  78.0 - 100.0 fL    MCH 28.2  26.0 - 34.0 pg    MCHC 34.5  30.0 - 36.0 g/dL    RDW 04.5  40.9 - 81.1 %    Platelets 207  150 - 400 K/uL   BASIC METABOLIC PANEL     Status: Abnormal   Collection Time   04/07/12  3:50 AM      Component Value Range Comment   Sodium 139  135 -  145 mEq/L    Potassium 3.7  3.5 - 5.1 mEq/L    Chloride 107  96 - 112 mEq/L    CO2 24  19 - 32 mEq/L    Glucose, Bld 123 (*) 70 - 99 mg/dL  BUN 15  6 - 23 mg/dL    Creatinine, Ser 3.24  0.50 - 1.35 mg/dL    Calcium 8.2 (*) 8.4 - 10.5 mg/dL    GFR calc non Af Amer >90  >90 mL/min    GFR calc Af Amer >90  >90 mL/min   MAGNESIUM     Status: Normal   Collection Time   04/07/12  3:50 AM      Component Value Range Comment   Magnesium 2.1  1.5 - 2.5 mg/dL   GLUCOSE, CAPILLARY     Status: Normal   Collection Time   04/07/12  4:55 AM      Component Value Range Comment   Glucose-Capillary 98  70 - 99 mg/dL   GLUCOSE, CAPILLARY     Status: Normal   Collection Time   04/07/12  6:02 AM      Component Value Range Comment   Glucose-Capillary 90  70 - 99 mg/dL    Comment 1 Notify RN      Comment 2 Documented in Chart     GLUCOSE, CAPILLARY     Status: Abnormal   Collection Time   04/07/12  7:26 AM      Component Value Range Comment   Glucose-Capillary 117 (*) 70 - 99 mg/dL    Comment 1 Documented in Chart      Comment 2 Notify RN       Imaging: Dg Chest 2 View  04/05/2012  *RADIOLOGY REPORT*  Clinical Data: Preoperative films.  Patient for CABG  CHEST - 2 VIEW  Comparison: PA and lateral chest 09/09/2008.  Findings: Lungs are clear.  Heart size is normal.  No pneumothorax or pleural fluid.  IMPRESSION: No acute disease.   Original Report Authenticated By: Bernadene Bell. D'ALESSIO, M.D.    Dg Chest Portable 1 View In Am  04/07/2012  *RADIOLOGY REPORT*  Clinical Data: Status post open heart surgery yesterday.  PORTABLE CHEST - 1 VIEW  Comparison: Yesterday.  Findings: The endotracheal and nasogastric tubes have been removed. The right jugular Swan-Ganz catheter tip is in the right main pulmonary artery.  Mediastinal and left chest tubes remain in place without pneumothorax.  Stable borderline enlarged cardiac silhouette and mild left lower lobe airspace opacity.  Stable post CABG changes.   IMPRESSION: Stable borderline cardiomegaly and mild left lower lobe atelectasis.   Original Report Authenticated By: Darrol Angel, M.D.    Dg Chest Portable 1 View  04/06/2012  *RADIOLOGY REPORT*  Clinical Data: Postop CABG.  PORTABLE CHEST - 1 VIEW 04/06/2012 1440 hours:  Comparison: Two-view chest x-ray yesterday and 09/09/2008.  Findings: Sternotomy for CABG.  Endotracheal tube tip in satisfactory position projecting approximately 5 cm above the carina.  Swan-Ganz catheter tip projects over the right main pulmonary artery.  Left chest tubes in place with no pneumothorax. Cardiac silhouette upper normal in size to slightly enlarged. Pulmonary vascularity normal.  Dense atelectasis in the left lower lobe.  Lungs otherwise clear.  IMPRESSION: Support apparatus satisfactory.  No pneumothorax.  Left lower lobe atelectasis post CABG.   Original Report Authenticated By: Arnell Sieving, M.D.     Assessment:  1. Principal Problem: 2.  *Unstable angina pectoris - resting chest pain with dynamic Anterior TWI 3. Active Problems: 4.  Diabetes mellitus type 2 in obese -- on oral medication, but non-compliant 5.  Hypertension 6.  CAD (coronary artery disease), native coronary artery - Proximal to Mid LAD 80% involving D1 & D2 7.  Plan:  1. Doing well POD #2 CABG. Vitals stable. In sinus rhythm. HCT stable. Repleting potassium.  Appears to be doing well. No suggestions at this time.  Time Spent Directly with Patient:  15 minutes  Length of Stay:  LOS: 3 days   Chrystie Nose, MD, [facility] Attending Cardiologist [facility]  Najee Manninen C 04/07/2012, 9:52 AM

## 2012-04-07 NOTE — Op Note (Signed)
NAME:  Gregory, Reese NO.:  [id number]  MEDICAL RECORD NO.:  [id number]  LOCATION:  2304                         FACILITY:  [facility]  PHYSICIAN:  Evelene Croon, M.D.     DATE OF BIRTH:  05/28/1956  DATE OF PROCEDURE:  04/06/2012 DATE OF DISCHARGE:                              OPERATIVE REPORT   PREOPERATIVE DIAGNOSIS:  Severe single-vessel coronary artery disease with unstable angina.  POSTOPERATIVE DIAGNOSIS:  Severe single-vessel coronary artery disease with unstable angina.  OPERATIVE PROCEDURE:  Median sternotomy, extracorporeal circulation, coronary artery bypass graft surgery x3 using a sequential left radial artery graft to the first and second diagonal branches of the left anterior descending artery, and a left internal mammary artery graft to the left anterior descending coronary artery.  ATTENDING SURGEON:  Evelene Croon, MD  ASSISTANT:  Doree Fudge, PA-C  ANESTHESIA:  General endotracheal.  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  He has had recurrent episodes of this discomfort and some of his episodes have been occurring at rest and waking him up at night.  This has more recently been associated with substernal chest discomfort as well as some tightness in his epigastrium. Electrocardiogram showed changes concerning for anterior ischemia. Cardiac catheterization showed severe single-vessel disease.  The LAD had a 50-80% proximal stenosis at the bifurcation of a large second diagonal branch.  The first diagonal appeared to be the culprit and was small-to-medium size vessel, had a hazy ostial 90% stenosis.  The  second diagonal had a long ostial and proximal 70-80% stenosis.  The left circumflex and right coronary arteries had no disease.  The right coronary artery was dominant.  Left ventricular ejection fraction was 55- 60% with mild anterolateral hypokinesis.  After review of the catheterization and examination of the patient, it was felt that coronary artery bypass graft surgery using a left internal mammary graft to the LAD and a sequential radial artery graft to the first two diagonal branches would the best long-term treatment for him. Preoperative upper extremity arterial Dopplers showed normal flow in the radial and ulnar arteries with a negative Allen's test bilaterally. Therefore, a red armband was placed on the left wrist and no IVs were placed on the left side.  I discussed the operative procedure of coronary artery bypass surgery including use of the left radial artery and left internal mammary artery with the patient and his wife.  We discussed alternatives, benefits, and risks including, but not limited to bleeding, blood transfusion, infection, stroke, myocardial infarction, graft failure, numbness or paresthesias in the left forearm and hand secondary to harvesting the left radial artery, as well as the possibility of left hand ischemia, which I felt it was unlikely given his negative Allen's test.  He understood all of this and agreed to proceed.  OPERATIVE PROCEDURE:  The patient was seen in the preoperative holding area and the  left arm was signed by me.  He was taken back to the operating room and placed on the table in supine position.  After induction of general endotracheal anesthesia, a Foley catheter was placed in the bladder using sterile technique.  Then, the patient was positioned with the left arm out on armboard.  The neck, chest, abdomen, and both lower extremities as well as the left arm were prepped in a single operative field.  Then, a time-out was taken,  proper patient, proper operation were confirmed with nursing and anesthesia staff. Then, the left radial artery was harvested through a longitudinal incision over the artery.  I initially exposed the artery at the wrist and encircled it with a silastic tape.  A nontraumatic vascular clamp was placed across the radial artery and there were still a good arterial Doppler signal beyond the clamp indicating adequate collateral flow from the ulnar artery.  Then, the left radial artery was exposed completely. The branches were divided using the Harmonic scalpel.  We did identify the cutaneous nerve and this was carefully preserved.  Then, the radial artery was clamped distally and divided and the stump suture ligated with 2-0 silk suture ligature.  There was excellent blood flow through the radial artery.  Then, the radial artery was clamped proximally and divided and the stump oversewn with a 2-0 silk suture ligature.  The radial artery was then flushed with [facility] solution.  The proximal end was cannulated with an 18-French Jelco soft catheter.  The side branches were clipped.  This was an excellent quality artery.  Then, hemostasis was achieved.  The arm incision was closed using continuous 3-0 Vicryl suture to reapproximate the subcutaneous tissue and 3-0 Vicryl subcuticular skin closure.  The sponge, needle, and instrument counts were correct according to the scrub nurse at this point.  Dry sterile dressing was applied over the incision and the arm was wrapped with Kerlix gauze and an Ace wrap.  Then, the chest was opened through a median sternotomy incision and the pericardium opened in the midline.  Examination of the heart showed good ventricular contractility.  The ascending aorta was of normal size and had no palpable plaques in it.  Then, the left internal mammary artery was harvested from the chest wall as a pedicle graft.  This was a medium-caliber vessel with excellent blood flow  through it.  The patient was then heparinized.  When an adequate ACT was obtained, the distal ascending aorta was cannulated using a 22-French aortic cannula for arterial inflow.  Venous outflow was achieved using a large 2-stage venous cannula placed through a pursestring suture in the right atrial appendage.  An antegrade cardioplegia and vent cannula was inserted in the aortic root.  The patient was placed on cardiopulmonary bypass and the distal coronary arteries were identified.  The LAD was heavily diseased proximally and had some segmental disease throughout the mid-to-distal portion of the vessel.  There was an area in the more distal portion of the vessel, which was soft enough to graft.  The second diagonal branch was a large vessel.  This vessel was also diffusely diseased.  There was one area distally that appeared free of disease and was suitable for grafting. The first diagonal branch was located proximally beyond the area of stenosis and was a soft vessel with no distal disease beyond that.  Then, the aorta was crossclamped and 1000 mL of cold blood antegrade cardioplegia was administered in the aortic root with quick arrest of the  heart.  Systemic hypothermia to 32 degrees centigrade and topical hypothermia with iced saline was used.  A temperature probe was placed in the septum and insulating pad in the pericardium.  The first distal anastomosis was performed to the first diagonal branch. The internal diameter of this vessel was 1.6 mm.  The conduit used was the left radial artery graft and this was anastomosed in a sequential side-to-side manner using continuous 8-0 Prolene suture.  Flow was noted through the graft and was excellent.  The second distal anastomosis was performed to the second diagonal branch.  The internal diameter of this vessel was 1.75 mm.  The conduit used was the left radial artery graft and this was anastomosed in a sequential end-to-side manner  using continuous 8-0 Prolene suture.  Flow was again measured through the graft and was excellent.  Another dose of cardioplegia was given in the aortic root.  The third distal anastomosis was performed to the distal LAD.  The internal diameter of this vessel was 1.75 mm at this location.  The conduit used was a left internal mammary graft and was brought out through an opening in the left pericardium anterior to the phrenic nerve.  This was anastomosed to the LAD in an end-to-side manner using continuous 8-0 Prolene suture.  The pedicle was sutured to the epicardium with 6-0 Prolene sutures.  Then with the crossclamp in place, the proximal anastomosis of the left radial artery graft was performed to the mid-ascending aorta in an end- to-side manner using continuous 7-0 Prolene suture.  Then, the clamp was removed from the mammary pedicle.  There was rapid warming of the ventricular septum and return of spontaneous ventricular fibrillation. Crossclamp was removed with a time of 71 minutes and the patient was spontaneously converted to sinus rhythm.  The proximal and distal anastomoses appeared hemostatic and allowed the grafts satisfactory. Graft markers were placed around the proximal anastomosis.  Two temporary right ventricular and right atrial pacing wires were placed and brought out through the skin.  When the patient was rewarmed to 37 degrees centigrade, he was weaned from cardiopulmonary bypass on no inotropic agents.  Total bypass time was 95 minutes.  Cardiac function appeared excellent with cardiac output of 6 liters/minute.  Protamine was given and the venous and aortic cannulas were removed without difficulty.  Hemostasis was achieved. Three chest tubes were placed with tube in the posterior pericardium, one in the left pleural space and one in the anterior mediastinum.  The sternum was then closed with double #6 stainless steel wires.  Fascia was closed with continuous  #1 Vicryl suture.  Subcutaneous tissue was closed with continuous 2-0 Vicryl and the skin with a 3-0 Vicryl subcuticular closure.  The sponge, needle, and instrument counts were correct according to the scrub nurse.  Dry sterile dressing was applied over the incisions around the chest tubes, which were hooked to Pleur- Evac suction.  The patient remained hemodynamically stable and was transported to the SICU in guarded, but stable condition.     Evelene Croon, M.D.     BB/MEDQ  D:  04/06/2012  T:  04/07/2012  Job:  [id number]  cc:   Landry Corporal, MD

## 2012-04-07 NOTE — Progress Notes (Signed)
Pt noted to have converted to a rapid Afib with a rate of 130-165, BP 109/57 and asymptomatic except for a "feeling of fluttering in my chest"; reassurance provided and Dr Laneta Simmers notified, amiodarone protocol started per order with 150mg  bolus and subsequent rate adjustments. Will cont' to monitor and assess for conversion back to NSR.

## 2012-04-07 NOTE — Progress Notes (Signed)
  Patient ID: Gregory Reese, male   DOB: [birth year], 56 y.o.   MRN: [id number]  SICU evening rounds:  Hemodynamically stable but went into A-fib with RVR tonight.  Will start amio. Otherwise doing well.

## 2012-04-07 NOTE — Progress Notes (Signed)
K+= 3.7 and creat= 0.77 w/ urine o/p > 30cc/hr; TCTS KCL protocol initiated.

## 2012-04-08 ENCOUNTER — Inpatient Hospital Stay (HOSPITAL_COMMUNITY): Payer: BC Managed Care – PPO

## 2012-04-08 DIAGNOSIS — E119 Type 2 diabetes mellitus without complications: Secondary | ICD-10-CM | POA: Insufficient documentation

## 2012-04-08 LAB — GLUCOSE, CAPILLARY
Glucose-Capillary: 175 mg/dL — ABNORMAL HIGH (ref 70–99)
Glucose-Capillary: 158 mg/dL — ABNORMAL HIGH (ref 70–99)
Glucose-Capillary: 200 mg/dL — ABNORMAL HIGH (ref 70–99)
Glucose-Capillary: 157 mg/dL — ABNORMAL HIGH (ref 70–99)

## 2012-04-08 LAB — BASIC METABOLIC PANEL
BUN: 19 mg/dL (ref 6–23)
Calcium: 8.3 mg/dL — ABNORMAL LOW (ref 8.4–10.5)
Chloride: 99 mEq/L (ref 96–112)
Creatinine, Ser: 0.97 mg/dL (ref 0.50–1.35)
GFR calc Af Amer: >90 mL/min (ref >90)

## 2012-04-08 LAB — CBC
HCT: 32.5 % — ABNORMAL LOW (ref 39.0–52.0)
MCHC: 33.8 g/dL (ref 30.0–36.0)
MCV: 83.1 fL (ref 78.0–100.0)
Platelets: 157 10*3/uL (ref 150–400)
RDW: 13.7 % (ref 11.5–15.5)
WBC: 13.3 10*3/uL — ABNORMAL HIGH (ref 4.0–10.5)

## 2012-04-08 MED ORDER — METOPROLOL TARTRATE 25 MG/10 ML ORAL SUSPENSION
12.5000 mg | Freq: Three times a day (TID) | ORAL | Status: DC
  Filled 2012-04-08 (×7): qty 5

## 2012-04-08 MED ORDER — AMIODARONE HCL 200 MG PO TABS
400.0000 mg | ORAL_TABLET | Freq: Two times a day (BID) | ORAL | Status: DC
  Administered 2012-04-08 – 2012-04-09 (×2): 400 mg via ORAL
  Filled 2012-04-08 (×3): qty 2

## 2012-04-08 MED ORDER — DILTIAZEM HCL 100 MG IV SOLR
5.0000 mg/h | INTRAVENOUS | Status: DC
  Administered 2012-04-08: 10 mg/h via INTRAVENOUS
  Filled 2012-04-08: qty 100

## 2012-04-08 MED ORDER — METOPROLOL TARTRATE 25 MG PO TABS
25.0000 mg | ORAL_TABLET | Freq: Three times a day (TID) | ORAL | Status: DC
  Administered 2012-04-08 – 2012-04-10 (×7): 25 mg via ORAL
  Filled 2012-04-08 (×10): qty 1

## 2012-04-08 MED ORDER — INSULIN GLARGINE 100 UNIT/ML ~~LOC~~ SOLN
15.0000 [IU] | Freq: Two times a day (BID) | SUBCUTANEOUS | Status: DC
  Administered 2012-04-08 – 2012-04-10 (×5): 15 [IU] via SUBCUTANEOUS

## 2012-04-08 MED ORDER — DILTIAZEM LOAD VIA INFUSION
10.0000 mg | Freq: Once | INTRAVENOUS | Status: AC
  Administered 2012-04-08: 10 mg via INTRAVENOUS
  Filled 2012-04-08: qty 10

## 2012-04-08 MED ORDER — METFORMIN HCL 850 MG PO TABS
850.0000 mg | ORAL_TABLET | Freq: Two times a day (BID) | ORAL | Status: DC
  Administered 2012-04-08 – 2012-04-12 (×9): 850 mg via ORAL
  Filled 2012-04-08 (×11): qty 1

## 2012-04-08 NOTE — Progress Notes (Signed)
Dr Laneta Simmers called r/t to Pt's HR continuing in rapid Afib with a rate of 130-160 and BP stable; Pt labs and status reviewed; orders received to start diltiazem at 22ml/hr after a 10mg  bolus; will continue to monitor and assess.

## 2012-04-08 NOTE — Progress Notes (Signed)
Patient ID: Gregory Reese, male   DOB: 10/10/1955, 57 y.o.   MRN: [id number]  SICU evening rounds  Hemodynamically stable. Sinus 98 at rest. Will switch amio to po and increase lopressor.

## 2012-04-08 NOTE — Plan of Care (Signed)
Problem: Phase III Progression Outcomes Goal: Dysrhythmias controlled Outcome: Progressing A fib requiring amiodarone and  cardiazem IV, extra day ICU per MD to monitor

## 2012-04-08 NOTE — Progress Notes (Signed)
[facility]  DAILY PROGRESS NOTE   Subjective:  Feels better - less sternotomy pain, but had AF with RVR overnight.  Objective:  Temp:  [97.6 F (36.4 C)-101.2 F (38.4 C)] 97.8 F (36.6 C) (09/01 0830) Pulse Rate:  [73-139] 76  (09/01 1000) Resp:  [19-31] 21  (09/01 1000) BP: (86-129)/(51-84) 86/51 mmHg (09/01 1000) SpO2:  [91 %-99 %] 96 % (09/01 1000) Weight:  [112.1 kg (247 lb 2.2 oz)] 112.1 kg (247 lb 2.2 oz) (09/01 0630) Weight change: -0.1 kg (-3.5 oz)  Intake/Output from previous day: 08/31 0701 - 09/01 0700 In: 3227 [P.O.:2280; I.V.:847; IV Piggyback:100] Out: 2775 [Urine:2735; Chest Tube:40]  Intake/Output from this shift: Total I/O In: 370.1 [P.O.:240; I.V.:130.1] Out: 260 [Urine:260]  Medications: Current Facility-Administered Medications  Medication Dose Route Frequency Provider Last Rate Last Dose  . 0.45 % sodium chloride infusion   Intravenous Continuous Ardelle Balls, PA 20 mL/hr at 04/06/12 1415    . 0.9 %  sodium chloride infusion   Intravenous Continuous Ardelle Balls, PA 20 mL/hr at 04/06/12 1415    . 0.9 %  sodium chloride infusion  250 mL Intravenous Continuous Ardelle Balls, PA      . albumin human 5 % solution 250 mL  250 mL Intravenous Q15 min PRN Ardelle Balls, PA      . amiodarone (NEXTERONE) 1.8 mg/mL load via infusion 150 mg  150 mg Intravenous Once Alleen Borne, MD 500.1 mL/hr at 04/07/12 2038 150 mg at 04/07/12 2038   And  . amiodarone (NEXTERONE PREMIX) 360 mg/200 mL dextrose IV infusion  60 mg/hr Intravenous Continuous Alleen Borne, MD 33.3 mL/hr at 04/08/12 0000 60 mg/hr at 04/08/12 0000   And  . amiodarone (NEXTERONE PREMIX) 360 mg/200 mL dextrose IV infusion  30 mg/hr Intravenous Continuous Alleen Borne, MD 16.7 mL/hr at 04/08/12 0725 30 mg/hr at 04/08/12 0725  . aspirin EC tablet 325 mg  325 mg Oral Daily Ardelle Balls, [place]   325 mg at 04/08/12 4098   Or  . aspirin  chewable tablet 324 mg  324 mg Per Tube Daily Donielle Margaretann Loveless, PA      . bisacodyl (DULCOLAX) EC tablet 10 mg  10 mg Oral Daily Ardelle Balls, PA   10 mg at 04/08/12 1191   Or  . bisacodyl (DULCOLAX) suppository 10 mg  10 mg Rectal Daily Donielle Margaretann Loveless, PA      . diltiazem (CARDIZEM) 1 mg/mL load via infusion 10 mg  10 mg Intravenous Once Alleen Borne, MD   10 mg at 04/08/12 0601   And  . diltiazem (CARDIZEM) 100 mg in dextrose 5 % 100 mL infusion  5-15 mg/hr Intravenous Continuous Alleen Borne, MD 10 mL/hr at 04/08/12 0700 10 mg/hr at 04/08/12 0700  . docusate sodium (COLACE) capsule 200 mg  200 mg Oral Daily Ardelle Balls, PA   200 mg at 04/08/12 0909  . enoxaparin (LOVENOX) injection 40 mg  40 mg Subcutaneous Q24H Alleen Borne, MD   40 mg at 04/08/12 0908  . fenofibrate tablet 160 mg  160 mg Oral Daily Lennette Bihari, MD   160 mg at 04/08/12 0909  . furosemide (LASIX) injection 40 mg  40 mg Intravenous BID Alleen Borne, MD   40 mg at 04/07/12 1722  . insulin aspart (novoLOG) injection 0-24 Units  0-24 Units Subcutaneous Q2H Alleen Borne, MD  2 Units at 04/07/12 1148  . insulin aspart (novoLOG) injection 0-24 Units  0-24 Units Subcutaneous Q4H Alleen Borne, MD   8 Units at 04/08/12 [phone]  . insulin glargine (LANTUS) injection 15 Units  15 Units Subcutaneous BID Alleen Borne, MD   15 Units at 04/08/12 0908  . insulin regular (NOVOLIN R,HUMULIN R) 1 Units/mL in sodium chloride 0.9 % 100 mL infusion   Intravenous Continuous Ardelle Balls, PA 0.9 mL/hr at 04/07/12 0600    . insulin regular bolus via infusion 0-10 Units  0-10 Units Intravenous TID WC Donielle Margaretann Loveless, PA      . isosorbide mononitrate (IMDUR) 24 hr tablet 15 mg  15 mg Oral Daily Ardelle Balls, PA   15 mg at 04/08/12 0908  . ketorolac (TORADOL) 15 MG/ML injection 15 mg  15 mg Intravenous Q6H PRN Alleen Borne, MD   15 mg at 04/08/12 0647  . lactated ringers infusion   Intravenous  Continuous Ardelle Balls, PA 20 mL/hr at 04/07/12 2329 20 mL/hr at 04/07/12 2329  . metFORMIN (GLUCOPHAGE) tablet 850 mg  850 mg Oral BID WC Alleen Borne, MD   850 mg at 04/08/12 1018  . metoprolol (LOPRESSOR) injection 2.5-5 mg  2.5-5 mg Intravenous Q2H PRN Ardelle Balls, PA      . metoprolol tartrate (LOPRESSOR) tablet 25 mg  25 mg Oral BID Alleen Borne, MD   25 mg at 04/08/12 9604   Or  . metoprolol tartrate (LOPRESSOR) 25 mg/10 mL oral suspension 12.5 mg  12.5 mg Per Tube BID Alleen Borne, MD      . morphine 2 MG/ML injection 2-5 mg  2-5 mg Intravenous Q1H PRN Ardelle Balls, PA   4 mg at 04/07/12 0912  . ondansetron (ZOFRAN) injection 4 mg  4 mg Intravenous Q6H PRN Ardelle Balls, PA      . oxyCODONE (Oxy IR/ROXICODONE) immediate release tablet 5-10 mg  5-10 mg Oral Q3H PRN Ardelle Balls, PA   10 mg at 04/08/12 0909  . pantoprazole (PROTONIX) EC tablet 40 mg  40 mg Oral Q1200 Ardelle Balls, PA      . potassium chloride SA (K-DUR,KLOR-CON) CR tablet 40 mEq  40 mEq Oral BID Alleen Borne, MD   40 mEq at 04/07/12 2154  . sodium chloride 0.9 % injection 3 mL  3 mL Intravenous Q12H Ardelle Balls, PA   3 mL at 04/08/12 0908  . sodium chloride 0.9 % injection 3 mL  3 mL Intravenous PRN Ardelle Balls, PA      . DISCONTD: acetaminophen (TYLENOL) solution 975 mg  975 mg Per Tube Q6H Ardelle Balls, PA   975 mg at 04/06/12 1733  . DISCONTD: acetaminophen (TYLENOL) tablet 1,000 mg  1,000 mg Oral Q6H Ardelle Balls, PA   1,000 mg at 04/08/12 0649  . DISCONTD: dexmedetomidine (PRECEDEX) 200 mcg / 50 mL infusion  0.1-0.7 mcg/kg/hr Intravenous Continuous Ardelle Balls, PA   0.1 mcg/kg/hr at 04/06/12 1600  . DISCONTD: insulin glargine (LANTUS) injection 10 Units  10 Units Subcutaneous QHS Alleen Borne, MD   10 Units at 04/07/12 2153  . DISCONTD: midazolam (VERSED) injection 2 mg  2 mg Intravenous Q1H PRN Ardelle Balls, PA       . DISCONTD: nitroGLYCERIN 0.2 mg/mL in dextrose 5 % infusion  0-100 mcg/min Intravenous Continuous Ardelle Balls, PA 1.5 mL/hr at 04/07/12 0600 5 mcg/min  at 04/07/12 0600  . DISCONTD: phenylephrine (NEO-SYNEPHRINE) 20,000 mcg in dextrose 5 % 250 mL infusion  0-100 mcg/min Intravenous Continuous Ardelle Balls, PA   10 mcg/min at 04/06/12 1700    Physical Exam: General appearance: alert, cooperative and no distress Neck: no adenopathy, no carotid bruit, no JVD, supple, symmetrical, trachea midline and thyroid not enlarged, symmetric, no tenderness/mass/nodules Lungs: clear to auscultation bilaterally and slightly diminished breath sounds at bases c/w atelectasis Heart: regular rate and rhythm, S1, S2 normal, no murmur, click, rub or gallop Abdomen: soft, non-tender; bowel sounds normal; no masses,  no organomegaly Extremities: extremities normal, atraumatic, no cyanosis or edema Pulses: 2+ and symmetric left radial harvest site healthy Skin: Skin color, texture, turgor normal. No rashes or lesions Neurologic: Alert and oriented X 3, normal strength and tone. Normal symmetric reflexes. Normal coordination and gait  Lab Results: Results for orders placed during the hospital encounter of 04/04/12 (from the past 48 hour(s))  POCT I-STAT 4, (NA,K, GLUC, HGB,HCT)     Status: Abnormal   Collection Time   04/06/12 10:46 AM      Component Value Range Comment   Sodium 141  135 - 145 mEq/L    Potassium 4.1  3.5 - 5.1 mEq/L    Glucose, Bld 127 (*) 70 - 99 mg/dL    HCT 19.1 (*) 47.8 - 52.0 %    Hemoglobin 11.9 (*) 13.0 - 17.0 g/dL   POCT I-STAT 4, (NA,K, GLUC, HGB,HCT)     Status: Abnormal   Collection Time   04/06/12 11:16 AM      Component Value Range Comment   Sodium 141  135 - 145 mEq/L    Potassium 4.0  3.5 - 5.1 mEq/L    Glucose, Bld 101 (*) 70 - 99 mg/dL    HCT 29.5 (*) 62.1 - 52.0 %    Hemoglobin 9.5 (*) 13.0 - 17.0 g/dL   POCT I-STAT 3, BLOOD GAS (G3+)     Status:  Abnormal   Collection Time   04/06/12 11:22 AM      Component Value Range Comment   pH, Arterial 7.366  7.350 - 7.450    pCO2 arterial 40.7  35.0 - 45.0 mmHg    pO2, Arterial 283.0 (*) 80.0 - 100.0 mmHg    Bicarbonate 23.3  20.0 - 24.0 mEq/L    TCO2 25  0 - 100 mmol/L    O2 Saturation 100.0      Acid-base deficit 2.0  0.0 - 2.0 mmol/L    Sample type ARTERIAL     POCT ACTIVATED CLOTTING TIME     Status: Normal   Collection Time   04/06/12 11:44 AM      Component Value Range Comment   Activated Clotting Time 359     HEMOGLOBIN AND HEMATOCRIT, BLOOD     Status: Abnormal   Collection Time   04/06/12 12:08 PM      Component Value Range Comment   Hemoglobin 11.1 (*) 13.0 - 17.0 g/dL DELTA CHECK NOTED   HCT 31.4 (*) 39.0 - 52.0 %   PLATELET COUNT     Status: Normal   Collection Time   04/06/12 12:08 PM      Component Value Range Comment   Platelets 192  150 - 400 K/uL   POCT I-STAT 4, (NA,K, GLUC, HGB,HCT)     Status: Abnormal   Collection Time   04/06/12 12:09 PM      Component Value Range Comment   Sodium  141  135 - 145 mEq/L    Potassium 4.7  3.5 - 5.1 mEq/L    Glucose, Bld 126 (*) 70 - 99 mg/dL    HCT 40.9 (*) 81.1 - 52.0 %    Hemoglobin 10.9 (*) 13.0 - 17.0 g/dL   POCT I-STAT 4, (NA,K, GLUC, HGB,HCT)     Status: Abnormal   Collection Time   04/06/12 12:59 PM      Component Value Range Comment   Sodium 142  135 - 145 mEq/L    Potassium 4.3  3.5 - 5.1 mEq/L    Glucose, Bld 151 (*) 70 - 99 mg/dL    HCT 91.4 (*) 78.2 - 52.0 %    Hemoglobin 10.5 (*) 13.0 - 17.0 g/dL   POCT I-STAT 3, BLOOD GAS (G3+)     Status: Abnormal   Collection Time   04/06/12  1:05 PM      Component Value Range Comment   pH, Arterial 7.370  7.350 - 7.450    pCO2 arterial 38.7  35.0 - 45.0 mmHg    pO2, Arterial 71.0 (*) 80.0 - 100.0 mmHg    Bicarbonate 22.4  20.0 - 24.0 mEq/L    TCO2 24  0 - 100 mmol/L    O2 Saturation 94.0      Acid-base deficit 3.0 (*) 0.0 - 2.0 mmol/L    Sample type ARTERIAL     CBC      Status: Abnormal   Collection Time   04/06/12  2:14 PM      Component Value Range Comment   WBC 12.4 (*) 4.0 - 10.5 K/uL    RBC 4.12 (*) 4.22 - 5.81 MIL/uL    Hemoglobin 11.9 (*) 13.0 - 17.0 g/dL    HCT 95.6 (*) 21.3 - 52.0 %    MCV 81.3  78.0 - 100.0 fL    MCH 28.9  26.0 - 34.0 pg    MCHC 35.5  30.0 - 36.0 g/dL    RDW 08.6  57.8 - 46.9 %    Platelets 159  150 - 400 K/uL   PROTIME-INR     Status: Abnormal   Collection Time   04/06/12  2:14 PM      Component Value Range Comment   Prothrombin Time 16.4 (*) 11.6 - 15.2 seconds    INR 1.30  0.00 - 1.49   APTT     Status: Normal   Collection Time   04/06/12  2:14 PM      Component Value Range Comment   aPTT 29  24 - 37 seconds   POCT I-STAT 4, (NA,K, GLUC, HGB,HCT)     Status: Abnormal   Collection Time   04/06/12  2:19 PM      Component Value Range Comment   Sodium 142  135 - 145 mEq/L    Potassium 3.7  3.5 - 5.1 mEq/L    Glucose, Bld 121 (*) 70 - 99 mg/dL    HCT 62.9 (*) 52.8 - 52.0 %    Hemoglobin 11.2 (*) 13.0 - 17.0 g/dL   POCT I-STAT 3, BLOOD GAS (G3+)     Status: Abnormal   Collection Time   04/06/12  2:23 PM      Component Value Range Comment   pH, Arterial 7.401  7.350 - 7.450    pCO2 arterial 37.0  35.0 - 45.0 mmHg    pO2, Arterial 69.0 (*) 80.0 - 100.0 mmHg    Bicarbonate 23.2  20.0 - 24.0 mEq/L  TCO2 24  0 - 100 mmol/L    O2 Saturation 94.0      Acid-base deficit 2.0  0.0 - 2.0 mmol/L    Patient temperature 36.4 C      Collection site ARTERIAL LINE      Drawn by Nurse      Sample type ARTERIAL     GLUCOSE, CAPILLARY     Status: Normal   Collection Time   04/06/12  3:28 PM      Component Value Range Comment   Glucose-Capillary 77  70 - 99 mg/dL   GLUCOSE, CAPILLARY     Status: Abnormal   Collection Time   04/06/12  4:26 PM      Component Value Range Comment   Glucose-Capillary 137 (*) 70 - 99 mg/dL   GLUCOSE, CAPILLARY     Status: Abnormal   Collection Time   04/06/12  5:23 PM      Component Value  Range Comment   Glucose-Capillary 135 (*) 70 - 99 mg/dL   POCT I-STAT 3, BLOOD GAS (G3+)     Status: Normal   Collection Time   04/06/12  5:40 PM      Component Value Range Comment   pH, Arterial 7.354  7.350 - 7.450    pCO2 arterial 40.9  35.0 - 45.0 mmHg    pO2, Arterial 84.0  80.0 - 100.0 mmHg    Bicarbonate 22.4  20.0 - 24.0 mEq/L    TCO2 24  0 - 100 mmol/L    O2 Saturation 95.0      Acid-base deficit 2.0  0.0 - 2.0 mmol/L    Patient temperature 38.5 C      Collection site ARTERIAL LINE      Drawn by Nurse      Sample type ARTERIAL     GLUCOSE, CAPILLARY     Status: Abnormal   Collection Time   04/06/12  6:08 PM      Component Value Range Comment   Glucose-Capillary 143 (*) 70 - 99 mg/dL   GLUCOSE, CAPILLARY     Status: Abnormal   Collection Time   04/06/12  6:47 PM      Component Value Range Comment   Glucose-Capillary 137 (*) 70 - 99 mg/dL   POCT I-STAT 3, BLOOD GAS (G3+)     Status: Abnormal   Collection Time   04/06/12  6:49 PM      Component Value Range Comment   pH, Arterial 7.336 (*) 7.350 - 7.450    pCO2 arterial 42.4  35.0 - 45.0 mmHg    pO2, Arterial 72.0 (*) 80.0 - 100.0 mmHg    Bicarbonate 22.3  20.0 - 24.0 mEq/L    TCO2 23  0 - 100 mmol/L    O2 Saturation 92.0      Acid-base deficit 3.0 (*) 0.0 - 2.0 mmol/L    Patient temperature 38.5 C      Collection site ARTERIAL LINE      Drawn by Nurse      Sample type ARTERIAL     GLUCOSE, CAPILLARY     Status: Abnormal   Collection Time   04/06/12  7:55 PM      Component Value Range Comment   Glucose-Capillary 109 (*) 70 - 99 mg/dL    Comment 1 Notify RN     CBC     Status: Abnormal   Collection Time   04/06/12  8:15 PM      Component Value Range Comment  WBC 17.4 (*) 4.0 - 10.5 K/uL    RBC 4.60  4.22 - 5.81 MIL/uL    Hemoglobin 13.3  13.0 - 17.0 g/dL    HCT 16.1 (*) 09.6 - 52.0 %    MCV 81.5  78.0 - 100.0 fL    MCH 28.9  26.0 - 34.0 pg    MCHC 35.5  30.0 - 36.0 g/dL    RDW 04.5  40.9 - 81.1 %     Platelets 200  150 - 400 K/uL   MAGNESIUM     Status: Normal   Collection Time   04/06/12  8:15 PM      Component Value Range Comment   Magnesium 2.3  1.5 - 2.5 mg/dL   CREATININE, SERUM     Status: Normal   Collection Time   04/06/12  8:15 PM      Component Value Range Comment   Creatinine, Ser 0.78  0.50 - 1.35 mg/dL    GFR calc non Af Amer >90  >90 mL/min    GFR calc Af Amer >90  >90 mL/min   GLUCOSE, CAPILLARY     Status: Abnormal   Collection Time   04/06/12  8:59 PM      Component Value Range Comment   Glucose-Capillary 140 (*) 70 - 99 mg/dL    Comment 1 Notify RN      Comment 2 Glucose Stabilizer     GLUCOSE, CAPILLARY     Status: Abnormal   Collection Time   04/06/12  9:58 PM      Component Value Range Comment   Glucose-Capillary 132 (*) 70 - 99 mg/dL    Comment 1 Notify RN      Comment 2 Glucose Stabilizer     GLUCOSE, CAPILLARY     Status: Abnormal   Collection Time   04/06/12 10:55 PM      Component Value Range Comment   Glucose-Capillary 127 (*) 70 - 99 mg/dL    Comment 1 Notify RN      Comment 2 Glucose Stabilizer     GLUCOSE, CAPILLARY     Status: Abnormal   Collection Time   04/06/12 11:50 PM      Component Value Range Comment   Glucose-Capillary 111 (*) 70 - 99 mg/dL    Comment 1 Documented in Chart      Comment 2 Notify RN     GLUCOSE, CAPILLARY     Status: Abnormal   Collection Time   04/07/12 12:47 AM      Component Value Range Comment   Glucose-Capillary 113 (*) 70 - 99 mg/dL   GLUCOSE, CAPILLARY     Status: Abnormal   Collection Time   04/07/12  1:49 AM      Component Value Range Comment   Glucose-Capillary 123 (*) 70 - 99 mg/dL   GLUCOSE, CAPILLARY     Status: Abnormal   Collection Time   04/07/12  2:54 AM      Component Value Range Comment   Glucose-Capillary 111 (*) 70 - 99 mg/dL   GLUCOSE, CAPILLARY     Status: Abnormal   Collection Time   04/07/12  3:47 AM      Component Value Range Comment   Glucose-Capillary 116 (*) 70 - 99 mg/dL   CBC      Status: Abnormal   Collection Time   04/07/12  3:50 AM      Component Value Range Comment   WBC 15.5 (*) 4.0 - 10.5 K/uL  RBC 4.36  4.22 - 5.81 MIL/uL    Hemoglobin 12.3 (*) 13.0 - 17.0 g/dL    HCT 16.1 (*) 09.6 - 52.0 %    MCV 81.9  78.0 - 100.0 fL    MCH 28.2  26.0 - 34.0 pg    MCHC 34.5  30.0 - 36.0 g/dL    RDW 04.5  40.9 - 81.1 %    Platelets 207  150 - 400 K/uL   BASIC METABOLIC PANEL     Status: Abnormal   Collection Time   04/07/12  3:50 AM      Component Value Range Comment   Sodium 139  135 - 145 mEq/L    Potassium 3.7  3.5 - 5.1 mEq/L    Chloride 107  96 - 112 mEq/L    CO2 24  19 - 32 mEq/L    Glucose, Bld 123 (*) 70 - 99 mg/dL    BUN 15  6 - 23 mg/dL    Creatinine, Ser 9.14  0.50 - 1.35 mg/dL    Calcium 8.2 (*) 8.4 - 10.5 mg/dL    GFR calc non Af Amer >90  >90 mL/min    GFR calc Af Amer >90  >90 mL/min   MAGNESIUM     Status: Normal   Collection Time   04/07/12  3:50 AM      Component Value Range Comment   Magnesium 2.1  1.5 - 2.5 mg/dL   GLUCOSE, CAPILLARY     Status: Normal   Collection Time   04/07/12  4:55 AM      Component Value Range Comment   Glucose-Capillary 98  70 - 99 mg/dL   GLUCOSE, CAPILLARY     Status: Normal   Collection Time   04/07/12  6:02 AM      Component Value Range Comment   Glucose-Capillary 90  70 - 99 mg/dL    Comment 1 Notify RN      Comment 2 Documented in Chart     GLUCOSE, CAPILLARY     Status: Abnormal   Collection Time   04/07/12  7:26 AM      Component Value Range Comment   Glucose-Capillary 117 (*) 70 - 99 mg/dL    Comment 1 Documented in Chart      Comment 2 Notify RN     GLUCOSE, CAPILLARY     Status: Abnormal   Collection Time   04/07/12  9:50 AM      Component Value Range Comment   Glucose-Capillary 137 (*) 70 - 99 mg/dL   GLUCOSE, CAPILLARY     Status: Abnormal   Collection Time   04/07/12 11:46 AM      Component Value Range Comment   Glucose-Capillary 137 (*) 70 - 99 mg/dL   MAGNESIUM     Status: Normal    Collection Time   04/07/12  3:15 PM      Component Value Range Comment   Magnesium 1.8  1.5 - 2.5 mg/dL   CBC     Status: Abnormal   Collection Time   04/07/12  3:15 PM      Component Value Range Comment   WBC 14.5 (*) 4.0 - 10.5 K/uL    RBC 3.89 (*) 4.22 - 5.81 MIL/uL    Hemoglobin 11.3 (*) 13.0 - 17.0 g/dL    HCT 78.2 (*) 95.6 - 52.0 %    MCV 83.3  78.0 - 100.0 fL    MCH 29.0  26.0 - 34.0 pg  MCHC 34.9  30.0 - 36.0 g/dL    RDW 16.1  09.6 - 04.5 %    Platelets 180  150 - 400 K/uL   CREATININE, SERUM     Status: Normal   Collection Time   04/07/12  3:15 PM      Component Value Range Comment   Creatinine, Ser 0.98  0.50 - 1.35 mg/dL    GFR calc non Af Amer >90  >90 mL/min    GFR calc Af Amer >90  >90 mL/min   POCT I-STAT, CHEM 8     Status: Abnormal   Collection Time   04/07/12  3:20 PM      Component Value Range Comment   Sodium 138  135 - 145 mEq/L    Potassium 4.2  3.5 - 5.1 mEq/L    Chloride 102  96 - 112 mEq/L    BUN 16  6 - 23 mg/dL    Creatinine, Ser 4.09  0.50 - 1.35 mg/dL    Glucose, Bld 811 (*) 70 - 99 mg/dL    Calcium, Ion 9.14  7.82 - 1.23 mmol/L    TCO2 23  0 - 100 mmol/L    Hemoglobin 11.2 (*) 13.0 - 17.0 g/dL    HCT 95.6 (*) 21.3 - 52.0 %   GLUCOSE, CAPILLARY     Status: Abnormal   Collection Time   04/07/12  7:49 PM      Component Value Range Comment   Glucose-Capillary 149 (*) 70 - 99 mg/dL    Comment 1 Notify RN      Comment 2 Documented in Chart     GLUCOSE, CAPILLARY     Status: Abnormal   Collection Time   04/07/12 11:24 PM      Component Value Range Comment   Glucose-Capillary 182 (*) 70 - 99 mg/dL    Comment 1 Notify RN      Comment 2 Documented in Chart     GLUCOSE, CAPILLARY     Status: Abnormal   Collection Time   04/08/12  1:27 AM      Component Value Range Comment   Glucose-Capillary 175 (*) 70 - 99 mg/dL    Comment 1 Notify RN      Comment 2 Documented in Chart     BASIC METABOLIC PANEL     Status: Abnormal   Collection Time   04/08/12   3:50 AM      Component Value Range Comment   Sodium 134 (*) 135 - 145 mEq/L    Potassium 4.0  3.5 - 5.1 mEq/L    Chloride 99  96 - 112 mEq/L    CO2 27  19 - 32 mEq/L    Glucose, Bld 180 (*) 70 - 99 mg/dL    BUN 19  6 - 23 mg/dL    Creatinine, Ser 0.86  0.50 - 1.35 mg/dL    Calcium 8.3 (*) 8.4 - 10.5 mg/dL    GFR calc non Af Amer >90  >90 mL/min    GFR calc Af Amer >90  >90 mL/min   CBC     Status: Abnormal   Collection Time   04/08/12  3:50 AM      Component Value Range Comment   WBC 13.3 (*) 4.0 - 10.5 K/uL    RBC 3.91 (*) 4.22 - 5.81 MIL/uL    Hemoglobin 11.0 (*) 13.0 - 17.0 g/dL    HCT 57.8 (*) 46.9 - 52.0 %    MCV 83.1  78.0 - 100.0 fL    MCH 28.1  26.0 - 34.0 pg    MCHC 33.8  30.0 - 36.0 g/dL    RDW 40.1  02.7 - 25.3 %    Platelets 157  150 - 400 K/uL   GLUCOSE, CAPILLARY     Status: Abnormal   Collection Time   04/08/12  4:17 AM      Component Value Range Comment   Glucose-Capillary 158 (*) 70 - 99 mg/dL    Comment 1 Notify RN      Comment 2 Documented in Chart     GLUCOSE, CAPILLARY     Status: Abnormal   Collection Time   04/08/12  8:28 AM      Component Value Range Comment   Glucose-Capillary 201 (*) 70 - 99 mg/dL     Imaging: Dg Chest Portable 1 View In Am  04/08/2012  *RADIOLOGY REPORT*  Clinical Data: Postop for cardiac surgery.  PORTABLE CHEST - 1 VIEW  Comparison: 1 day prior  Findings: Removal of Swan-Ganz catheter with right IJ Cordis sheath remaining in place. Prior median sternotomy.  Removal of 2 left chest tubes, without pneumothorax.  Mild cardiomegaly.  Probable small left pleural effusion. No congestive failure.  Low lung volumes.  Patchy left base atelectasis.  IMPRESSION:  1.  Removal of Swan-Ganz catheter and left chest tubes, without pneumothorax. 2.  Similar small left pleural effusion with adjacent atelectasis.   Original Report Authenticated By: Consuello Bossier, M.D.    Dg Chest Portable 1 View In Am  04/07/2012  *RADIOLOGY REPORT*  Clinical Data:  Status post open heart surgery yesterday.  PORTABLE CHEST - 1 VIEW  Comparison: Yesterday.  Findings: The endotracheal and nasogastric tubes have been removed. The right jugular Swan-Ganz catheter tip is in the right main pulmonary artery.  Mediastinal and left chest tubes remain in place without pneumothorax.  Stable borderline enlarged cardiac silhouette and mild left lower lobe airspace opacity.  Stable post CABG changes.  IMPRESSION: Stable borderline cardiomegaly and mild left lower lobe atelectasis.   Original Report Authenticated By: Darrol Angel, M.D.    Dg Chest Portable 1 View  04/06/2012  *RADIOLOGY REPORT*  Clinical Data: Postop CABG.  PORTABLE CHEST - 1 VIEW 04/06/2012 1440 hours:  Comparison: Two-view chest x-ray yesterday and 09/09/2008.  Findings: Sternotomy for CABG.  Endotracheal tube tip in satisfactory position projecting approximately 5 cm above the carina.  Swan-Ganz catheter tip projects over the right main pulmonary artery.  Left chest tubes in place with no pneumothorax. Cardiac silhouette upper normal in size to slightly enlarged. Pulmonary vascularity normal.  Dense atelectasis in the left lower lobe.  Lungs otherwise clear.  IMPRESSION: Support apparatus satisfactory.  No pneumothorax.  Left lower lobe atelectasis post CABG.   Original Report Authenticated By: Arnell Sieving, M.D.     Assessment:  1. Principal Problem: 2.  *Unstable angina pectoris - resting chest pain with dynamic Anterior TWI 3. Active Problems: 4.  Diabetes mellitus type 2 in obese -- on oral medication, but non-compliant 5.  Hypertension 6.  CAD (coronary artery disease), native coronary artery - Proximal to Mid LAD 80% involving D1 & D2 7.   Plan:  1. Good progress post op other than AF, now back in NSR. DC diltiazem, keep on IV amiodarone for 24h, then PO amiodarone for 30 days. If AF recurs within this hospital stay would also recommend at least 30 days anticoagulation.  Time Spent  Directly with Patient:  20 minutes  Length of Stay:  LOS: 4 days    Gregory Reese 04/08/2012, 10:45 AM

## 2012-04-08 NOTE — Progress Notes (Signed)
2 Days Post-Op Procedure(s) (LRB): CORONARY ARTERY BYPASS GRAFTING (CABG) (N/A) RADIAL ARTERY HARVEST (Left) RADIAL ARTERY HARVEST (Left) Subjective: Sweating from pain meds  Objective: Vital signs in last 24 hours: Temp:  [97.6 F (36.4 C)-101.2 F (38.4 C)] 97.8 F (36.6 C) (09/01 0830) Pulse Rate:  [73-139] 88  (09/01 0700) Cardiac Rhythm:  [-] Normal sinus rhythm (09/01 0728) Resp:  [19-31] 27  (09/01 0800) BP: (92-129)/(54-84) 108/59 mmHg (09/01 0800) SpO2:  [91 %-99 %] 94 % (09/01 0800) Arterial Line BP: (135)/(59) 135/59 mmHg (08/31 0900) Weight:  [112.1 kg (247 lb 2.2 oz)] 112.1 kg (247 lb 2.2 oz) (09/01 0630)  Hemodynamic parameters for last 24 hours: PAP: (30)/(14) 30/14 mmHg  Intake/Output from previous day: 08/31 0701 - 09/01 0700 In: 3227 [P.O.:2280; I.V.:847; IV Piggyback:100] Out: 2775 [Urine:2735; Chest Tube:40] Intake/Output this shift: Total I/O In: 46.7 [I.V.:46.7] Out: 170 [Urine:170]  General appearance: alert and cooperative Heart: regular rate and rhythm, S1, S2 normal, no murmur, click, rub or gallop Lungs: diminished breath sounds bibasilar Extremities: edema minimal Wound: incision ok  Lab Results:  Basename 04/08/12 0350 04/07/12 1520 04/07/12 1515  WBC 13.3* -- 14.5*  HGB 11.0* 11.2* --  HCT 32.5* 33.0* --  PLT 157 -- 180   BMET:  Basename 04/08/12 0350 04/07/12 1520 04/07/12 0350  NA 134* 138 --  K 4.0 4.2 --  CL 99 102 --  CO2 27 -- 24  GLUCOSE 180* 240* --  BUN 19 16 --  CREATININE 0.97 1.10 --  CALCIUM 8.3* -- 8.2*    PT/INR:  Basename 04/06/12 1414  LABPROT 16.4*  INR 1.30   ABG    Component Value Date/Time   PHART 7.336* 04/06/2012 1849   HCO3 22.3 04/06/2012 1849   TCO2 23 04/07/2012 1520   ACIDBASEDEF 3.0* 04/06/2012 1849   O2SAT 92.0 04/06/2012 1849   CBG (last 3)   Basename 04/08/12 0417 04/08/12 0127 04/07/12 2324  GLUCAP 158* 175* 182*   CXR: mild left basilar atelectasis  Assessment/Plan: S/P  Procedure(s) (LRB): CORONARY ARTERY BYPASS GRAFTING (CABG) (N/A) RADIAL ARTERY HARVEST (Left) RADIAL ARTERY HARVEST (Left) Postop A-fib with RVR: converted this am on amio and cardizem. Will continue IV amio today and wean off cardizem. Continue lopressor. Mobilize Diuresis Diabetes control:  Give lantus bid and resume glucophage at 850bid. Hgb A1c was 8.5 preop. DC foley   LOS: 4 days    Gregory Reese K 04/08/2012

## 2012-04-08 NOTE — Progress Notes (Signed)
Pt with c/o incr. Diaphoresis and thirst, temperature recheck revealed afebrile 98.59F, rechecked CBG=174; cool wash cloth provided along with reassurance, will cont' to monitor and assess.

## 2012-04-08 NOTE — Progress Notes (Signed)
Pt's Rt IJ SGC introducer D/C'd per MD order after amiodarone drip converted to PO; Catheter removed intact after placing Pt supine and performing a valsalva maneuver; removal was without resistance and pressure held at site x 10 mins until hemostasis noted, petroleum gauze and sterile 4x4 applied to site and secured with tape. Pt tolerated procedure without any difficulties

## 2012-04-08 NOTE — Plan of Care (Signed)
Problem: Phase III Progression Outcomes Goal: CBGs/Blood glucose < or equal to 120 Outcome: Progressing requiring sliding scale and lantus, restarting metformin today for increased CBG's

## 2012-04-09 LAB — GLUCOSE, CAPILLARY
Glucose-Capillary: 169 mg/dL — ABNORMAL HIGH (ref 70–99)
Glucose-Capillary: 168 mg/dL — ABNORMAL HIGH (ref 70–99)
Glucose-Capillary: 150 mg/dL — ABNORMAL HIGH (ref 70–99)

## 2012-04-09 MED ORDER — HYDROCODONE-ACETAMINOPHEN 10-325 MG PO TABS
1.0000 | ORAL_TABLET | ORAL | Status: DC | PRN
  Administered 2012-04-09 – 2012-04-10 (×4): 1 via ORAL
  Filled 2012-04-09 (×4): qty 1

## 2012-04-09 MED ORDER — LINAGLIPTIN 5 MG PO TABS
5.0000 mg | ORAL_TABLET | Freq: Every day | ORAL | Status: DC
  Administered 2012-04-09 – 2012-04-12 (×4): 5 mg via ORAL
  Filled 2012-04-09 (×4): qty 1

## 2012-04-09 MED ORDER — AMIODARONE HCL IN DEXTROSE 360-4.14 MG/200ML-% IV SOLN: 30.0000 mg/h | INTRAVENOUS | Status: DC

## 2012-04-09 MED ORDER — DILTIAZEM HCL 100 MG IV SOLR
10.0000 mg/h | INTRAVENOUS | Status: DC
  Administered 2012-04-09: 10 mg/h via INTRAVENOUS
  Filled 2012-04-09: qty 100

## 2012-04-09 MED ORDER — DIGOXIN 125 MCG PO TABS
0.1250 mg | ORAL_TABLET | Freq: Every day | ORAL | Status: DC
  Administered 2012-04-10 – 2012-04-12 (×3): 0.125 mg via ORAL
  Filled 2012-04-09 (×3): qty 1

## 2012-04-09 MED ORDER — DIGOXIN 0.25 MG/ML IJ SOLN
0.2500 mg | Freq: Once | INTRAMUSCULAR | Status: AC
Start: 2012-04-09 — End: 2012-04-09
  Administered 2012-04-09: 0.25 mg via INTRAVENOUS
  Filled 2012-04-09: qty 1

## 2012-04-09 MED ORDER — AMIODARONE HCL 200 MG PO TABS
400.0000 mg | ORAL_TABLET | Freq: Two times a day (BID) | ORAL | Status: DC
  Administered 2012-04-10 – 2012-04-12 (×5): 400 mg via ORAL
  Filled 2012-04-09 (×6): qty 2

## 2012-04-09 MED ORDER — AMIODARONE IV BOLUS ONLY 150 MG/100ML
150.0000 mg | Freq: Once | INTRAVENOUS | Status: AC
  Administered 2012-04-09: 150 mg via INTRAVENOUS

## 2012-04-09 MED ORDER — DILTIAZEM LOAD VIA INFUSION
10.0000 mg | Freq: Once | INTRAVENOUS | Status: AC
  Filled 2012-04-09: qty 10

## 2012-04-09 MED ORDER — DIGOXIN 0.25 MG/ML IJ SOLN
0.5000 mg | Freq: Once | INTRAMUSCULAR | Status: AC
  Administered 2012-04-09: 0.5 mg via INTRAVENOUS
  Filled 2012-04-09: qty 2

## 2012-04-09 MED ORDER — AMIODARONE LOAD VIA INFUSION
150.0000 mg | Freq: Once | INTRAVENOUS | Status: DC
  Filled 2012-04-09: qty 83.34

## 2012-04-09 MED ORDER — TEMAZEPAM 15 MG PO CAPS
15.0000 mg | ORAL_CAPSULE | Freq: Every evening | ORAL | Status: DC | PRN
  Administered 2012-04-10 – 2012-04-11 (×2): 15 mg via ORAL
  Filled 2012-04-09 (×2): qty 1

## 2012-04-09 MED FILL — Potassium Chloride Inj 2 mEq/ML: INTRAVENOUS | Qty: 40 | Status: AC

## 2012-04-09 MED FILL — Magnesium Sulfate Inj 50%: INTRAMUSCULAR | Qty: 10 | Status: AC

## 2012-04-09 NOTE — Progress Notes (Signed)
3 Days Post-Op Procedure(s) (LRB): CORONARY ARTERY BYPASS GRAFTING (CABG) (N/A) RADIAL ARTERY HARVEST (Left) RADIAL ARTERY HARVEST (Left) Subjective: Sweating which he thinks may be due to Toradol  Objective: Vital signs in last 24 hours: Temp:  [97.7 F (36.5 C)-100.2 F (37.9 C)] 98.8 F (37.1 C) (09/02 0740) Pulse Rate:  [68-117] 98  (09/02 0900) Cardiac Rhythm:  [-] Atrial fibrillation (09/02 1000) Resp:  [14-32] 25  (09/02 1000) BP: (93-152)/(50-78) 152/75 mmHg (09/02 1000) SpO2:  [91 %-99 %] 94 % (09/02 1000) Weight:  [112.6 kg (248 lb 3.8 oz)] 112.6 kg (248 lb 3.8 oz) (09/02 0600)  Hemodynamic parameters for last 24 hours:    Intake/Output from previous day: 09/01 0701 - 09/02 0700 In: 2293.8 [P.O.:1740; I.V.:553.8] Out: 1335 [Urine:1335] Intake/Output this shift: Total I/O In: 60 [P.O.:60] Out: -   General appearance: alert and cooperative Heart: irregularly irregular rhythm Lungs: clear to auscultation bilaterally Extremities: extremities normal, atraumatic, no cyanosis or edema Wound: incisions ok  Lab Results:  Basename 04/08/12 0350 04/07/12 1520 04/07/12 1515  WBC 13.3* -- 14.5*  HGB 11.0* 11.2* --  HCT 32.5* 33.0* --  PLT 157 -- 180   BMET:  Basename 04/08/12 0350 04/07/12 1520 04/07/12 0350  NA 134* 138 --  K 4.0 4.2 --  CL 99 102 --  CO2 27 -- 24  GLUCOSE 180* 240* --  BUN 19 16 --  CREATININE 0.97 1.10 --  CALCIUM 8.3* -- 8.2*    PT/INR:  Basename 04/06/12 1414  LABPROT 16.4*  INR 1.30   ABG    Component Value Date/Time   PHART 7.336* 04/06/2012 1849   HCO3 22.3 04/06/2012 1849   TCO2 23 04/07/2012 1520   ACIDBASEDEF 3.0* 04/06/2012 1849   O2SAT 92.0 04/06/2012 1849   CBG (last 3)   Basename 04/09/12 0735 04/09/12 0338 04/08/12 2308  GLUCAP 168* 129* 145*    Assessment/Plan: S/P Procedure(s) (LRB): CORONARY ARTERY BYPASS GRAFTING (CABG) (N/A) RADIAL ARTERY HARVEST (Left) RADIAL ARTERY HARVEST (Left) Mobilize Diabetes  control:  Resume Tradjenta and continue metformin. Wean lantus. Postop recurrent A-fib:  back in it this am. Will give amio bolus and cardizem bolus and drip. He only has a peripheral IV and amio drip will cause phlebitis quickly so will not use that and continue po amio. Cardizem slowed him down and converted him rapidly before. Will plan anticoagulation as noted by cardiology one pacing wires out.  Will switch oxycodone to hydrocodone to see if that resolves the sweating.   LOS: 5 days    Gregory Reese K 04/09/2012

## 2012-04-09 NOTE — Progress Notes (Signed)
Patient ID: Gregory Reese, male   DOB: 05/12/1956, 56 y.o.   MRN: [id number] SICU evening rounds:  He remains in A-fib with rate 95-110 on IV cardizem 10mg /hr, oral amio, lopressor. He is 130's with ambulation. Will start digoxin for better rate control and when rate slows or converts can probably stop cardizem.

## 2012-04-09 NOTE — Progress Notes (Signed)
[facility]  DAILY PROGRESS NOTE   Subjective:  Was in NSR until 7 AM, now AF with RVR again (120 bpm, started shortly after IV amio was stopped, despite PO amio dose). Only complaint is sternal pain.  Objective:  Temp:  [97.7 F (36.5 C)-100.2 F (37.9 C)] 98.8 F (37.1 C) (09/02 0740) Pulse Rate:  [68-117] 98  (09/02 0900) Resp:  [14-32] 25  (09/02 1000) BP: (93-152)/(50-78) 152/75 mmHg (09/02 1000) SpO2:  [91 %-99 %] 94 % (09/02 1000) Weight:  [112.6 kg (248 lb 3.8 oz)] 112.6 kg (248 lb 3.8 oz) (09/02 0600) Weight change: 0.5 kg (1 lb 1.6 oz)  Intake/Output from previous day: 09/01 0701 - 09/02 0700 In: 2293.8 [P.O.:1740; I.V.:553.8] Out: 1335 [Urine:1335]  Intake/Output from this shift: Total I/O In: 60 [P.O.:60] Out: -   Medications: Current Facility-Administered Medications  Medication Dose Route Frequency Provider Last Rate Last Dose  . 0.45 % sodium chloride infusion   Intravenous Continuous Ardelle Balls, PA 20 mL/hr at 04/06/12 1415    . 0.9 %  sodium chloride infusion   Intravenous Continuous Ardelle Balls, PA 20 mL/hr at 04/06/12 1415    . 0.9 %  sodium chloride infusion  250 mL Intravenous Continuous Donielle Margaretann Loveless, PA      . amiodarone (NEXTERONE) 1.8 mg/mL load via infusion 150 mg  150 mg Intravenous Once Thurmon Fair, MD       And  . amiodarone (NEXTERONE PREMIX) 360 mg/200 mL dextrose IV infusion  30 mg/hr Intravenous Continuous Seanna Sisler, MD      . amiodarone (PACERONE) tablet 400 mg  400 mg Oral BID Thurmon Fair, MD   400 mg at 04/09/12 0939  . aspirin EC tablet 325 mg  325 mg Oral Daily Ardelle Balls, [place]   325 mg at 04/09/12 1610   Or  . aspirin chewable tablet 324 mg  324 mg Per Tube Daily Donielle Margaretann Loveless, PA      . bisacodyl (DULCOLAX) EC tablet 10 mg  10 mg Oral Daily Ardelle Balls, PA   10 mg at 04/09/12 9604   Or  . bisacodyl (DULCOLAX) suppository 10 mg  10 mg Rectal  Daily Ardelle Balls, PA      . docusate sodium (COLACE) capsule 200 mg  200 mg Oral Daily Ardelle Balls, PA   200 mg at 04/09/12 0943  . enoxaparin (LOVENOX) injection 40 mg  40 mg Subcutaneous Q24H Alleen Borne, MD   40 mg at 04/09/12 0939  . fenofibrate tablet 160 mg  160 mg Oral Daily Lennette Bihari, MD   160 mg at 04/09/12 0939  . insulin aspart (novoLOG) injection 0-24 Units  0-24 Units Subcutaneous Q4H Alleen Borne, MD   4 Units at 04/09/12 0755  . insulin glargine (LANTUS) injection 15 Units  15 Units Subcutaneous BID Alleen Borne, MD   15 Units at 04/09/12 [phone]  . insulin regular (NOVOLIN R,HUMULIN R) 1 Units/mL in sodium chloride 0.9 % 100 mL infusion   Intravenous Continuous Ardelle Balls, PA 0.9 mL/hr at 04/07/12 0600    . insulin regular bolus via infusion 0-10 Units  0-10 Units Intravenous TID WC Donielle Margaretann Loveless, PA      . isosorbide mononitrate (IMDUR) 24 hr tablet 15 mg  15 mg Oral Daily Ardelle Balls, PA   15 mg at 04/09/12 0939  . ketorolac (TORADOL) 15 MG/ML injection 15 mg  15 mg Intravenous Q6H PRN Alleen Borne, MD   15 mg at 04/09/12 0155  . lactated ringers infusion   Intravenous Continuous Ardelle Balls, PA 20 mL/hr at 04/07/12 2329 20 mL/hr at 04/07/12 2329  . metFORMIN (GLUCOPHAGE) tablet 850 mg  850 mg Oral BID WC Alleen Borne, MD   850 mg at 04/09/12 0755  . metoprolol (LOPRESSOR) injection 2.5-5 mg  2.5-5 mg Intravenous Q2H PRN Ardelle Balls, PA   5 mg at 04/09/12 0857  . metoprolol tartrate (LOPRESSOR) tablet 25 mg  25 mg Oral TID Alleen Borne, MD   25 mg at 04/09/12 1610   Or  . metoprolol tartrate (LOPRESSOR) 25 mg/10 mL oral suspension 12.5 mg  12.5 mg Per Tube TID Alleen Borne, MD      . morphine 2 MG/ML injection 2-5 mg  2-5 mg Intravenous Q1H PRN Ardelle Balls, PA   4 mg at 04/07/12 0912  . ondansetron (ZOFRAN) injection 4 mg  4 mg Intravenous Q6H PRN Ardelle Balls, PA      . oxyCODONE (Oxy  IR/ROXICODONE) immediate release tablet 5-10 mg  5-10 mg Oral Q3H PRN Ardelle Balls, PA   10 mg at 04/09/12 0827  . pantoprazole (PROTONIX) EC tablet 40 mg  40 mg Oral Q1200 Ardelle Balls, PA   40 mg at 04/08/12 1133  . sodium chloride 0.9 % injection 3 mL  3 mL Intravenous Q12H Ardelle Balls, PA   3 mL at 04/09/12 0857  . sodium chloride 0.9 % injection 3 mL  3 mL Intravenous PRN Ardelle Balls, PA      . DISCONTD: amiodarone (NEXTERONE PREMIX) 360 mg/200 mL dextrose IV infusion  30 mg/hr Intravenous Continuous Alleen Borne, MD 16.7 mL/hr at 04/08/12 2100 30 mg/hr at 04/08/12 2100  . DISCONTD: diltiazem (CARDIZEM) 100 mg in dextrose 5 % 100 mL infusion  5-15 mg/hr Intravenous Continuous Alleen Borne, MD 10 mL/hr at 04/08/12 0700 10 mg/hr at 04/08/12 0700  . DISCONTD: metoprolol tartrate (LOPRESSOR) 25 mg/10 mL oral suspension 12.5 mg  12.5 mg Per Tube BID Alleen Borne, MD      . DISCONTD: metoprolol tartrate (LOPRESSOR) tablet 25 mg  25 mg Oral BID Alleen Borne, MD   25 mg at 04/08/12 0909    Physical Exam: General appearance: alert, cooperative and no distress Neck: no adenopathy, no carotid bruit, no JVD, supple, symmetrical, trachea midline and thyroid not enlarged, symmetric, no tenderness/mass/nodules Lungs: diminished breath sounds bibasilar Heart: irregularly irregular rhythm, S1, S2 normal, no S3 or S4 and no rub Abdomen: soft, non-tender; bowel sounds normal; no masses,  no organomegaly Extremities: extremities normal, atraumatic, no cyanosis or edema and left radial and RLE saphenectomy sites healthy Neurologic: Grossly normal  Lab Results: Results for orders placed during the hospital encounter of 04/04/12 (from the past 48 hour(s))  GLUCOSE, CAPILLARY     Status: Abnormal   Collection Time   04/07/12 11:46 AM      Component Value Range Comment   Glucose-Capillary 137 (*) 70 - 99 mg/dL   MAGNESIUM     Status: Normal   Collection Time   04/07/12   3:15 PM      Component Value Range Comment   Magnesium 1.8  1.5 - 2.5 mg/dL   CBC     Status: Abnormal   Collection Time   04/07/12  3:15 PM      Component Value Range  Comment   WBC 14.5 (*) 4.0 - 10.5 K/uL    RBC 3.89 (*) 4.22 - 5.81 MIL/uL    Hemoglobin 11.3 (*) 13.0 - 17.0 g/dL    HCT 16.1 (*) 09.6 - 52.0 %    MCV 83.3  78.0 - 100.0 fL    MCH 29.0  26.0 - 34.0 pg    MCHC 34.9  30.0 - 36.0 g/dL    RDW 04.5  40.9 - 81.1 %    Platelets 180  150 - 400 K/uL   CREATININE, SERUM     Status: Normal   Collection Time   04/07/12  3:15 PM      Component Value Range Comment   Creatinine, Ser 0.98  0.50 - 1.35 mg/dL    GFR calc non Af Amer >90  >90 mL/min    GFR calc Af Amer >90  >90 mL/min   POCT I-STAT, CHEM 8     Status: Abnormal   Collection Time   04/07/12  3:20 PM      Component Value Range Comment   Sodium 138  135 - 145 mEq/L    Potassium 4.2  3.5 - 5.1 mEq/L    Chloride 102  96 - 112 mEq/L    BUN 16  6 - 23 mg/dL    Creatinine, Ser 9.14  0.50 - 1.35 mg/dL    Glucose, Bld 782 (*) 70 - 99 mg/dL    Calcium, Ion 9.56  2.13 - 1.23 mmol/L    TCO2 23  0 - 100 mmol/L    Hemoglobin 11.2 (*) 13.0 - 17.0 g/dL    HCT 08.6 (*) 57.8 - 52.0 %   GLUCOSE, CAPILLARY     Status: Abnormal   Collection Time   04/07/12  7:49 PM      Component Value Range Comment   Glucose-Capillary 149 (*) 70 - 99 mg/dL    Comment 1 Notify RN      Comment 2 Documented in Chart     GLUCOSE, CAPILLARY     Status: Abnormal   Collection Time   04/07/12 11:24 PM      Component Value Range Comment   Glucose-Capillary 182 (*) 70 - 99 mg/dL    Comment 1 Notify RN      Comment 2 Documented in Chart     GLUCOSE, CAPILLARY     Status: Abnormal   Collection Time   04/08/12  1:27 AM      Component Value Range Comment   Glucose-Capillary 175 (*) 70 - 99 mg/dL    Comment 1 Notify RN      Comment 2 Documented in Chart     BASIC METABOLIC PANEL     Status: Abnormal   Collection Time   04/08/12  3:50 AM      Component  Value Range Comment   Sodium 134 (*) 135 - 145 mEq/L    Potassium 4.0  3.5 - 5.1 mEq/L    Chloride 99  96 - 112 mEq/L    CO2 27  19 - 32 mEq/L    Glucose, Bld 180 (*) 70 - 99 mg/dL    BUN 19  6 - 23 mg/dL    Creatinine, Ser 4.69  0.50 - 1.35 mg/dL    Calcium 8.3 (*) 8.4 - 10.5 mg/dL    GFR calc non Af Amer >90  >90 mL/min    GFR calc Af Amer >90  >90 mL/min   CBC     Status: Abnormal  Collection Time   04/08/12  3:50 AM      Component Value Range Comment   WBC 13.3 (*) 4.0 - 10.5 K/uL    RBC 3.91 (*) 4.22 - 5.81 MIL/uL    Hemoglobin 11.0 (*) 13.0 - 17.0 g/dL    HCT 16.1 (*) 09.6 - 52.0 %    MCV 83.1  78.0 - 100.0 fL    MCH 28.1  26.0 - 34.0 pg    MCHC 33.8  30.0 - 36.0 g/dL    RDW 04.5  40.9 - 81.1 %    Platelets 157  150 - 400 K/uL   GLUCOSE, CAPILLARY     Status: Abnormal   Collection Time   04/08/12  4:17 AM      Component Value Range Comment   Glucose-Capillary 158 (*) 70 - 99 mg/dL    Comment 1 Notify RN      Comment 2 Documented in Chart     GLUCOSE, CAPILLARY     Status: Abnormal   Collection Time   04/08/12  8:28 AM      Component Value Range Comment   Glucose-Capillary 201 (*) 70 - 99 mg/dL   GLUCOSE, CAPILLARY     Status: Abnormal   Collection Time   04/08/12 12:07 PM      Component Value Range Comment   Glucose-Capillary 259 (*) 70 - 99 mg/dL    Comment 1 Notify RN      Comment 2 Documented in Chart     GLUCOSE, CAPILLARY     Status: Abnormal   Collection Time   04/08/12  1:04 PM      Component Value Range Comment   Glucose-Capillary 200 (*) 70 - 99 mg/dL   GLUCOSE, CAPILLARY     Status: Abnormal   Collection Time   04/08/12  4:12 PM      Component Value Range Comment   Glucose-Capillary 157 (*) 70 - 99 mg/dL   GLUCOSE, CAPILLARY     Status: Abnormal   Collection Time   04/08/12  7:46 PM      Component Value Range Comment   Glucose-Capillary 173 (*) 70 - 99 mg/dL    Comment 1 Notify RN      Comment 2 Documented in Chart     GLUCOSE, CAPILLARY     Status:  Abnormal   Collection Time   04/08/12 11:08 PM      Component Value Range Comment   Glucose-Capillary 145 (*) 70 - 99 mg/dL    Comment 1 Notify RN      Comment 2 Documented in Chart     GLUCOSE, CAPILLARY     Status: Abnormal   Collection Time   04/09/12  3:38 AM      Component Value Range Comment   Glucose-Capillary 129 (*) 70 - 99 mg/dL    Comment 1 Notify RN      Comment 2 Documented in Chart     GLUCOSE, CAPILLARY     Status: Abnormal   Collection Time   04/09/12  7:35 AM      Component Value Range Comment   Glucose-Capillary 168 (*) 70 - 99 mg/dL    Comment 1 Notify RN      Comment 2 Documented in Chart       Imaging: Dg Chest Portable 1 View In Am  04/08/2012  *RADIOLOGY REPORT*  Clinical Data: Postop for cardiac surgery.  PORTABLE CHEST - 1 VIEW  Comparison: 1 day prior  Findings: Removal of  Swan-Ganz catheter with right IJ Cordis sheath remaining in place. Prior median sternotomy.  Removal of 2 left chest tubes, without pneumothorax.  Mild cardiomegaly.  Probable small left pleural effusion. No congestive failure.  Low lung volumes.  Patchy left base atelectasis.  IMPRESSION:  1.  Removal of Swan-Ganz catheter and left chest tubes, without pneumothorax. 2.  Similar small left pleural effusion with adjacent atelectasis.   Original Report Authenticated By: Consuello Bossier, M.D.     Assessment:  1. Principal Problem: 2.  *Unstable angina pectoris - resting chest pain with dynamic Anterior TWI 3. Active Problems: 4.  Diabetes mellitus type 2 in obese -- on oral medication, but non-compliant 5.  Hypertension 6.  CAD (coronary artery disease), native coronary artery - Proximal to Mid LAD 80% involving D1 & D2 7. Postoperative atrial fibrillation with RVR  Plan:  1. Resume IV amiodarone for another 24 hours then switch to PO 2. Will recommend Xarelto for 6-12 weeks, to start only after pacing wires are removed  Time Spent Directly with Patient:  20 minutes  Length of Stay:   LOS: 5 days    Gearald Stonebraker 04/09/2012, 10:40 AM

## 2012-04-10 ENCOUNTER — Encounter (HOSPITAL_COMMUNITY): Payer: Self-pay | Admitting: Surgery

## 2012-04-10 LAB — GLUCOSE, CAPILLARY
Glucose-Capillary: 124 mg/dL — ABNORMAL HIGH (ref 70–99)
Glucose-Capillary: 184 mg/dL — ABNORMAL HIGH (ref 70–99)

## 2012-04-10 MED ORDER — SODIUM CHLORIDE 0.9 % IV SOLN: 250.0000 mL | INTRAVENOUS | Status: DC | PRN

## 2012-04-10 MED ORDER — POTASSIUM CHLORIDE CRYS ER 20 MEQ PO TBCR
EXTENDED_RELEASE_TABLET | ORAL | Status: AC
  Filled 2012-04-10: qty 1

## 2012-04-10 MED ORDER — ACETAMINOPHEN 325 MG PO TABS: 650.0000 mg | ORAL_TABLET | Freq: Four times a day (QID) | ORAL | Status: DC | PRN

## 2012-04-10 MED ORDER — SODIUM CHLORIDE 0.9 % IJ SOLN: 3.0000 mL | INTRAMUSCULAR | Status: DC | PRN

## 2012-04-10 MED ORDER — SODIUM CHLORIDE 0.9 % IJ SOLN: 3.0000 mL | Freq: Two times a day (BID) | INTRAMUSCULAR | Status: DC

## 2012-04-10 MED ORDER — LACTULOSE 10 GM/15ML PO SOLN
20.0000 g | Freq: Once | ORAL | Status: AC
  Administered 2012-04-10: 20 g via ORAL
  Filled 2012-04-10: qty 30

## 2012-04-10 MED ORDER — OXYCODONE HCL 5 MG PO TABS
5.0000 mg | ORAL_TABLET | ORAL | Status: DC | PRN
  Administered 2012-04-10 – 2012-04-12 (×9): 10 mg via ORAL
  Filled 2012-04-10 (×9): qty 2

## 2012-04-10 MED ORDER — INSULIN ASPART 100 UNIT/ML ~~LOC~~ SOLN
0.0000 [IU] | SUBCUTANEOUS | Status: DC
  Administered 2012-04-10: 2 [IU] via SUBCUTANEOUS
  Administered 2012-04-10: 4 [IU] via SUBCUTANEOUS

## 2012-04-10 MED ORDER — TRAMADOL HCL 50 MG PO TABS: 50.0000 mg | ORAL_TABLET | ORAL | Status: DC | PRN

## 2012-04-10 MED ORDER — GLUCERNA SHAKE PO LIQD: 237.0000 mL | Freq: Every day | ORAL | Status: DC | PRN

## 2012-04-10 MED ORDER — POTASSIUM CHLORIDE CRYS ER 20 MEQ PO TBCR
20.0000 meq | EXTENDED_RELEASE_TABLET | Freq: Every day | ORAL | Status: DC
  Administered 2012-04-10: 20 meq via ORAL
  Filled 2012-04-10: qty 1

## 2012-04-10 MED ORDER — MOVING RIGHT ALONG BOOK
Freq: Once | Status: AC
  Administered 2012-04-10: 12:00:00
  Filled 2012-04-10: qty 1

## 2012-04-10 MED ORDER — INSULIN ASPART 100 UNIT/ML ~~LOC~~ SOLN
0.0000 [IU] | Freq: Three times a day (TID) | SUBCUTANEOUS | Status: AC
  Administered 2012-04-10 – 2012-04-11 (×2): 2 [IU] via SUBCUTANEOUS
  Administered 2012-04-11: 4 [IU] via SUBCUTANEOUS

## 2012-04-10 MED ORDER — FUROSEMIDE 40 MG PO TABS
40.0000 mg | ORAL_TABLET | Freq: Every day | ORAL | Status: DC
  Administered 2012-04-10: 40 mg via ORAL
  Filled 2012-04-10 (×2): qty 1

## 2012-04-10 NOTE — Progress Notes (Signed)
CARDIAC REHAB PHASE I   PRE:  Rate/Rhythm: 81SR  BP:  Supine: 163/79  Sitting:   Standing:    SaO2: 92%RA  MODE:  Ambulation: 1100 ft   POST:  Rate/Rhythem: 96SR  BP:  Supine:   Sitting: 182/78,172/82  Standing:    SaO2: 91-92%RA 1410-1450 Pt to bathroom prior to walk. Walked 1100 ft on RA with hand held asst. Gait steady. Tolerated well except for elevated BP. Back to bed after walk. No complaints except cold. Got pt extra blanket.  Duanne Limerick

## 2012-04-10 NOTE — Progress Notes (Addendum)
Nutrition Follow-up  Intervention:    Glucerna Shake daily PRN (220 kcals, 9.9 gm protein per 8 fl oz bottle) RD to follow for nutrition care plan  Assessment:   Patient extubated 8/30 PM. Patient mother reports he's eating well; PO intake 75% this AM. Would benefit from supplement -- RD to order PRN. Orders to transfer to 2000.  Diet Order:  Carbohydrate Modified High Calorie  Meds: Scheduled Meds:   . amiodarone  150 mg Intravenous Once  . amiodarone  150 mg Intravenous Once  . amiodarone  400 mg Oral BID  . aspirin EC  325 mg Oral Daily   Or  . aspirin  324 mg Per Tube Daily  . bisacodyl  10 mg Oral Daily   Or  . bisacodyl  10 mg Rectal Daily  . digoxin  0.25 mg Intravenous Once  . digoxin  0.5 mg Intravenous Once  . digoxin  0.125 mg Oral Daily  . diltiazem  10 mg Intravenous Once  . docusate sodium  200 mg Oral Daily  . enoxaparin (LOVENOX) injection  40 mg Subcutaneous Q24H  . fenofibrate  160 mg Oral Daily  . insulin aspart  0-24 Units Subcutaneous Q4H  . insulin glargine  15 Units Subcutaneous BID  . insulin regular  0-10 Units Intravenous TID WC  . isosorbide mononitrate  15 mg Oral Daily  . lactulose  20 g Oral Once  . linagliptin  5 mg Oral Daily  . metFORMIN  850 mg Oral BID WC  . metoprolol tartrate  25 mg Oral TID  . pantoprazole  40 mg Oral Q1200  . sodium chloride  3 mL Intravenous Q12H  . DISCONTD: amiodarone  400 mg Oral BID  . DISCONTD: metoprolol tartrate  12.5 mg Per Tube TID   Continuous Infusions:   . sodium chloride 10 mL/hr at 04/10/12 0400  . sodium chloride 20 mL/hr at 04/06/12 1415  . sodium chloride    . DISCONTD: amiodarone (NEXTERONE PREMIX) 360 mg/200 mL dextrose    . DISCONTD: diltiazem (CARDIZEM) infusion 10 mg/hr (04/10/12 0400)  . DISCONTD: insulin (NOVOLIN-R) infusion 0.9 mL/hr at 04/07/12 0600  . DISCONTD: lactated ringers 20 mL/hr (04/07/12 2329)   PRN Meds:.HYDROcodone-acetaminophen, metoprolol, morphine injection,  ondansetron (ZOFRAN) IV, sodium chloride, temazepam, DISCONTD: ketorolac, DISCONTD: oxyCODONE  Labs:  CMP     Component Value Date/Time   NA 134* 04/08/2012 0350   K 4.0 04/08/2012 0350   CL 99 04/08/2012 0350   CO2 27 04/08/2012 0350   GLUCOSE 180* 04/08/2012 0350   BUN 19 04/08/2012 0350   CREATININE 0.97 04/08/2012 0350   CALCIUM 8.3* 04/08/2012 0350   PROT 6.8 04/04/2012 0827   ALBUMIN 3.7 04/04/2012 0827   AST 16 04/04/2012 0827   ALT 24 04/04/2012 0827   ALKPHOS 54 04/04/2012 0827   BILITOT 0.4 04/04/2012 0827   GFRNONAA >90 04/08/2012 0350   GFRAA >90 04/08/2012 0350     Intake/Output Summary (Last 24 hours) at 04/10/12 1023 Last data filed at 04/10/12 1000  Gross per 24 hour  Intake   1590 ml  Output   2702 ml  Net  -1112 ml    CBG (last 3)   Basename 04/10/12 0829 04/09/12 1923 04/09/12 1655  GLUCAP 148* 150* 161*    Weight Status:  112.4 kg (9/3) -- stable  Re-estimated needs:  2200-2400 kcals, 130-140 gm protein  Nutrition Dx:  Inadequate Oral Intake, progressing  New Goal:  Oral & supplemental intake to meet >/=  90% of estimated nutrition needs, unmet  Monitor:  PO & supplemental intake, weight, labs, I/O's  Kirkland Hun, RD, LDN Pager #: [phone] After-Hours Pager #: [phone]

## 2012-04-10 NOTE — Progress Notes (Addendum)
Pt has no IV access. IV team has attempted and has been unsuccessful. Called PA [place], and she advised to monitor heart rhythm closely. If pt begins to go into Afib, call MD on call and request midline/PICC. She is aware he has no IV access at the moment and is ok with this as he is in sinus rhythm. There is a possibility of discharge tomorrow. Will monitor closely.  Also spoke with Dr. Laneta Simmers who is aware of no IV access and is ok with this this as Pt is in sinus rhythm.

## 2012-04-10 NOTE — Progress Notes (Signed)
[facility]  Subjective: Not sleeping well.  Objective: Vital signs in last 24 hours: Temp:  [97.7 F (36.5 C)-98.8 F (37.1 C)] 97.7 F (36.5 C) (09/03 0832) Pulse Rate:  [66-102] 80  (09/03 1000) Resp:  [18-27] 20  (09/03 1000) BP: (109-159)/(52-88) 149/75 mmHg (09/03 1000) SpO2:  [90 %-99 %] 90 % (09/03 1000) Weight:  [112.4 kg (247 lb 12.8 oz)] 112.4 kg (247 lb 12.8 oz) (09/03 0600) Last BM Date: 04/04/12  Intake/Output from previous day: 09/02 0701 - 09/03 0700 In: 1390 [P.O.:660; I.V.:630; IV Piggyback:100] Out: 2500 [Urine:2500] Intake/Output this shift: Total I/O In: 260 [P.O.:260] Out: 202 [Urine:201; Stool:1]  Medications Current Facility-Administered Medications  Medication Dose Route Frequency Provider Last Rate Last Dose  . 0.45 % sodium chloride infusion   Intravenous Continuous Ardelle Balls, PA 10 mL/hr at 04/10/12 0400    . 0.9 %  sodium chloride infusion   Intravenous Continuous Ardelle Balls, PA 20 mL/hr at 04/06/12 1415    . 0.9 %  sodium chloride infusion  250 mL Intravenous Continuous Donielle Margaretann Loveless, PA      . amiodarone (NEXTERONE) 1.8 mg/mL load via infusion 150 mg  150 mg Intravenous Once Mihai Croitoru, MD      . amiodarone (NEXTERONE) IV bolus only 150 mg/100 mL  150 mg Intravenous Once Mihai Croitoru, MD   150 mg at 04/09/12 1121  . amiodarone (PACERONE) tablet 400 mg  400 mg Oral BID Alleen Borne, MD   400 mg at 04/10/12 0934  . aspirin EC tablet 325 mg  325 mg Oral Daily Ardelle Balls, [place]   325 mg at 04/10/12 1610   Or  . aspirin chewable tablet 324 mg  324 mg Per Tube Daily Donielle Margaretann Loveless, PA      . bisacodyl (DULCOLAX) EC tablet 10 mg  10 mg Oral Daily Ardelle Balls, PA   10 mg at 04/10/12 9604   Or  . bisacodyl (DULCOLAX) suppository 10 mg  10 mg Rectal Daily Ardelle Balls, PA      . digoxin (LANOXIN) 0.25 MG/ML injection 0.25 mg  0.25 mg Intravenous Once Alleen Borne, MD   0.25 mg at 04/09/12 2200  . digoxin (LANOXIN) 0.25 MG/ML injection 0.5 mg  0.5 mg Intravenous Once Alleen Borne, MD   0.5 mg at 04/09/12 1636  . digoxin (LANOXIN) tablet 0.125 mg  0.125 mg Oral Daily Alleen Borne, MD   0.125 mg at 04/10/12 0934  . diltiazem (CARDIZEM) 1 mg/mL load via infusion 10 mg  10 mg Intravenous Once Alleen Borne, MD      . docusate sodium (COLACE) capsule 200 mg  200 mg Oral Daily Ardelle Balls, PA   200 mg at 04/10/12 0934  . enoxaparin (LOVENOX) injection 40 mg  40 mg Subcutaneous Q24H Alleen Borne, MD   40 mg at 04/10/12 0934  . fenofibrate tablet 160 mg  160 mg Oral Daily Lennette Bihari, MD   160 mg at 04/10/12 0934  . HYDROcodone-acetaminophen (NORCO) 10-325 MG per tablet 1 tablet  1 tablet Oral Q4H PRN Alleen Borne, MD   1 tablet at 04/10/12 0758  . insulin aspart (novoLOG) injection 0-24 Units  0-24 Units Subcutaneous Q4H Alleen Borne, MD   2 Units at 04/10/12 [phone]  . insulin glargine (LANTUS) injection 15 Units  15 Units Subcutaneous BID Alleen Borne, MD   15 Units  at 04/10/12 0935  . insulin regular bolus via infusion 0-10 Units  0-10 Units Intravenous TID WC Donielle Margaretann Loveless, PA      . isosorbide mononitrate (IMDUR) 24 hr tablet 15 mg  15 mg Oral Daily Ardelle Balls, PA   15 mg at 04/10/12 0934  . lactulose (CHRONULAC) 10 GM/15ML solution 20 g  20 g Oral Once Ardelle Balls, PA   20 g at 04/10/12 0758  . linagliptin (TRADJENTA) tablet 5 mg  5 mg Oral Daily Alleen Borne, MD   5 mg at 04/10/12 0934  . metFORMIN (GLUCOPHAGE) tablet 850 mg  850 mg Oral BID WC Alleen Borne, MD   850 mg at 04/10/12 0758  . metoprolol (LOPRESSOR) injection 2.5-5 mg  2.5-5 mg Intravenous Q2H PRN Ardelle Balls, PA   5 mg at 04/09/12 0857  . metoprolol tartrate (LOPRESSOR) tablet 25 mg  25 mg Oral TID Alleen Borne, MD   25 mg at 04/10/12 0934  . morphine 2 MG/ML injection 2-5 mg  2-5 mg Intravenous Q1H PRN Ardelle Balls,  PA   4 mg at 04/10/12 0416  . ondansetron (ZOFRAN) injection 4 mg  4 mg Intravenous Q6H PRN Ardelle Balls, PA      . pantoprazole (PROTONIX) EC tablet 40 mg  40 mg Oral Q1200 Ardelle Balls, PA   40 mg at 04/09/12 1221  . sodium chloride 0.9 % injection 3 mL  3 mL Intravenous Q12H Ardelle Balls, PA   3 mL at 04/10/12 0935  . sodium chloride 0.9 % injection 3 mL  3 mL Intravenous PRN Ardelle Balls, PA      . temazepam (RESTORIL) capsule 15 mg  15 mg Oral QHS PRN Alleen Borne, MD      . DISCONTD: amiodarone (NEXTERONE PREMIX) 360 mg/200 mL dextrose IV infusion  30 mg/hr Intravenous Continuous Mihai Croitoru, MD      . DISCONTD: amiodarone (PACERONE) tablet 400 mg  400 mg Oral BID Thurmon Fair, MD   400 mg at 04/09/12 0939  . DISCONTD: diltiazem (CARDIZEM) 100 mg in dextrose 5 % 100 mL infusion  10 mg/hr Intravenous Continuous Alleen Borne, MD 10 mL/hr at 04/10/12 0400 10 mg/hr at 04/10/12 0400  . DISCONTD: insulin regular (NOVOLIN R,HUMULIN R) 1 Units/mL in sodium chloride 0.9 % 100 mL infusion   Intravenous Continuous Ardelle Balls, PA 0.9 mL/hr at 04/07/12 0600    . DISCONTD: ketorolac (TORADOL) 15 MG/ML injection 15 mg  15 mg Intravenous Q6H PRN Alleen Borne, MD   15 mg at 04/09/12 0155  . DISCONTD: lactated ringers infusion   Intravenous Continuous Ardelle Balls, PA 20 mL/hr at 04/07/12 2329 20 mL/hr at 04/07/12 2329  . DISCONTD: metoprolol tartrate (LOPRESSOR) 25 mg/10 mL oral suspension 12.5 mg  12.5 mg Per Tube TID Alleen Borne, MD      . DISCONTD: oxyCODONE (Oxy IR/ROXICODONE) immediate release tablet 5-10 mg  5-10 mg Oral Q3H PRN Ardelle Balls, PA   10 mg at 04/09/12 0827    PE: NAD No JVD Decreased BS bases, RRR 1/6 sem ABD; soft Trace edema persists lower extremities  Lab Results:   Basename 04/08/12 0350 04/07/12 1520 04/07/12 1515  WBC 13.3* -- 14.5*  HGB 11.0* 11.2* 11.3*  HCT 32.5* 33.0* 32.4*  PLT 157 -- 180    BMET  Basename 04/08/12 0350 04/07/12 1520 04/07/12 1515  NA 134* 138 --  K 4.0 4.2 --  CL 99 102 --  CO2 27 -- --  GLUCOSE 180* 240* --  BUN 19 16 --  CREATININE 0.97 1.10 0.98  CALCIUM 8.3* -- --   Assessment/Plan  Principal Problem:  *Unstable angina pectoris - resting chest pain with dynamic Anterior TWI Active Problems:  Diabetes mellitus type 2 in obese -- on oral medication, but non-compliant  Hypertension  CAD (coronary artery disease), native coronary artery - Proximal to Mid LAD 80% involving D1 & D2  Plan:  Maintaining NSR.  PO Amio started today.  Xarelto starting tomorrow after pacer wires removed.  ASA, digoxin, imdur, lopressor.   LOS: 6 days    HAGER, BRYAN 04/10/2012 10:25 AM   Patient seen and examined. Agree with assessment and plan. Doing well day 4 s/p CABG.  Maintaining NSR, now transferred to 2000. Continue diuresis; weight 5 lbs increase from admission. Cardiac Rehab.   Lennette Bihari, MD, [facility] 04/10/2012 2:44 PM

## 2012-04-10 NOTE — Progress Notes (Addendum)
4 Days Post-Op Procedure(s) (LRB): CORONARY ARTERY BYPASS GRAFTING (CABG) (N/A) RADIAL ARTERY HARVEST (Left) RADIAL ARTERY HARVEST (Left)  Subjective: Patient with complaints of generalized abdominal soreness and constipation. Denies nausea or emesis.  Objective: Vital signs in last 24 hours: Patient Vitals for the past 24 hrs:  BP Temp Temp src Pulse Resp SpO2 Weight  04/10/12 0700 130/64 mmHg - - 80  24  96 % -  04/10/12 0600 130/64 mmHg - - 70  19  96 % 247 lb 12.8 oz (112.4 kg)  04/10/12 0500 122/59 mmHg - - 69  18  95 % -  04/10/12 0400 137/67 mmHg 98.5 F (36.9 C) Oral 72  18  95 % -  04/10/12 0300 134/63 mmHg - - 71  20  95 % -  04/10/12 0200 136/65 mmHg - - 80  22  99 % -  04/10/12 0100 122/56 mmHg - - 88  19  93 % -  04/10/12 0000 121/64 mmHg 98.3 F (36.8 C) Oral 77  21  93 % -  04/09/12 2300 118/57 mmHg - - 85  21  94 % -  04/09/12 2200 122/64 mmHg - - 97  22  92 % -  04/09/12 2100 132/67 mmHg - - 80  20  96 % -  04/09/12 2000 130/65 mmHg - - 90  22  93 % -  04/09/12 1920 - 98.8 F (37.1 C) Oral - - - -  04/09/12 1900 113/60 mmHg - - 102  24  92 % -  04/09/12 1800 125/62 mmHg - - 96  24  94 % -  04/09/12 1700 121/64 mmHg - - 91  22  95 % -  04/09/12 1600 119/88 mmHg - - 93  20  93 % -  04/09/12 1531 - 97.8 F (36.6 C) Oral - - - -  04/09/12 1500 115/52 mmHg - - 93  27  96 % -  04/09/12 1400 119/66 mmHg - - 97  25  96 % -  04/09/12 1300 116/72 mmHg - - 100  23  99 % -  04/09/12 1217 - 97.8 F (36.6 C) Oral - - - -  04/09/12 1200 122/74 mmHg - - 66  24  99 % -  04/09/12 1100 109/67 mmHg - - - 21  - -  04/09/12 1000 152/75 mmHg - - - 25  94 % -  04/09/12 0900 125/62 mmHg - - 98  24  94 % -  04/09/12 0800 111/70 mmHg - - 117  23  93 % -  04/09/12 0740 - 98.8 F (37.1 C) Oral - - - -   Pre op weight 112 kg Current Weight  04/10/12 247 lb 12.8 oz (112.4 kg)      Intake/Output from previous day: 09/02 0701 - 09/03 0700 In: 1390 [P.O.:660; I.V.:630; IV  Piggyback:100] Out: 2500 [Urine:2500]   Physical Exam:  Cardiovascular: RRR, no murmurs, gallops, or rubs. Pulmonary: Mostly clear to auscultation bilaterally; no rales, wheezes, or rhonchi. Abdomen: Soft, non tender, some distention, bowel sounds present. Extremities: Trace bilateral lower extremity edema. Wounds: Clean and dry.  No erythema or signs of infection.  Lab Results: CBC: Basename 04/08/12 0350 04/07/12 1520 04/07/12 1515  WBC 13.3* -- 14.5*  HGB 11.0* 11.2* --  HCT 32.5* 33.0* --  PLT 157 -- 180   BMET:  Basename 04/08/12 0350 04/07/12 1520  NA 134* 138  K 4.0 4.2  CL 99 102  CO2 27 --  GLUCOSE 180* 240*  BUN 19 16  CREATININE 0.97 1.10  CALCIUM 8.3* --    PT/INR:  Lab Results  Component Value Date   INR 1.30 04/06/2012   INR 1.00 04/04/2012   ABG:  INR: Will add last result for INR, ABG once components are confirmed Will add last 4 CBG results once components are confirmed  Assessment/Plan:  1. CV - Previous afib with RVR.Converted to SR.Continue Amiodarone 400 bid, Digoxin 0.125 daily, Lopressor 25 tid, and Imdur 15 daily.Stop Cardizem drip. 2.  Pulmonary - Encourage incentive spirometer. 3. Volume Overload - Continue with diuresis. 4.  Acute blood loss anemia - Last H and H stable at 11 and 32.5. 5. LOC constipation. 6.DM-CBGs 195/161/150.Pre op HGA1C 8.4.Continue Metformin 850 bid and Tradjenta 5 daily. Will need follow up as an outpatient. 7.Likely transfer to [facility]U.  ZIMMERMAN,DONIELLE MPA-C 04/10/2012    Chart reviewed, patient examined, agree with above. He is back in sinus rhythm. Will continue amio, digoxin, lopressor. Cardizem is off. Plan to remove pacing wires tomorrow am and start Xarelto for 6-12 weeks.

## 2012-04-11 ENCOUNTER — Inpatient Hospital Stay (HOSPITAL_COMMUNITY): Payer: BC Managed Care – PPO

## 2012-04-11 DIAGNOSIS — I4891 Unspecified atrial fibrillation: Secondary | ICD-10-CM

## 2012-04-11 DIAGNOSIS — I480 Paroxysmal atrial fibrillation: Secondary | ICD-10-CM | POA: Diagnosis not present

## 2012-04-11 HISTORY — DX: Unspecified atrial fibrillation: I48.91

## 2012-04-11 LAB — CBC
MCHC: 34 g/dL (ref 30.0–36.0)
MCV: 83.1 fL (ref 78.0–100.0)
Platelets: 287 10*3/uL (ref 150–400)
RDW: 13.2 % (ref 11.5–15.5)
WBC: 9.5 10*3/uL (ref 4.0–10.5)

## 2012-04-11 LAB — GLUCOSE, CAPILLARY
Glucose-Capillary: 132 mg/dL — ABNORMAL HIGH (ref 70–99)
Glucose-Capillary: 142 mg/dL — ABNORMAL HIGH (ref 70–99)

## 2012-04-11 MED ORDER — ONDANSETRON HCL 4 MG PO TABS
4.0000 mg | ORAL_TABLET | Freq: Three times a day (TID) | ORAL | Status: DC | PRN
  Administered 2012-04-11: 4 mg via ORAL
  Filled 2012-04-11: qty 1

## 2012-04-11 MED ORDER — ATORVASTATIN CALCIUM 10 MG PO TABS
10.0000 mg | ORAL_TABLET | Freq: Every day | ORAL | Status: DC
  Administered 2012-04-11: 10 mg via ORAL
  Filled 2012-04-11 (×2): qty 1

## 2012-04-11 MED ORDER — POTASSIUM CHLORIDE CRYS ER 20 MEQ PO TBCR
20.0000 meq | EXTENDED_RELEASE_TABLET | Freq: Two times a day (BID) | ORAL | Status: DC
  Administered 2012-04-11 – 2012-04-12 (×3): 20 meq via ORAL
  Filled 2012-04-11 (×3): qty 1

## 2012-04-11 MED ORDER — FUROSEMIDE 40 MG PO TABS
40.0000 mg | ORAL_TABLET | Freq: Two times a day (BID) | ORAL | Status: DC
  Administered 2012-04-11 – 2012-04-12 (×3): 40 mg via ORAL
  Filled 2012-04-11 (×4): qty 1

## 2012-04-11 MED ORDER — LIVING WELL WITH DIABETES BOOK
Freq: Once | Status: AC
  Administered 2012-04-11: 13:00:00
  Filled 2012-04-11: qty 1

## 2012-04-11 MED ORDER — METOPROLOL TARTRATE 50 MG PO TABS
50.0000 mg | ORAL_TABLET | Freq: Two times a day (BID) | ORAL | Status: DC
  Administered 2012-04-11 – 2012-04-12 (×3): 50 mg via ORAL
  Filled 2012-04-11 (×4): qty 1

## 2012-04-11 NOTE — Progress Notes (Signed)
Removed pt's chest tube sutures and EPW per MD order. INR was WNL and no arrhythmias over night. Tips were intact on the EPW, no bleeding noted. Applied steri strips and benzoin to chest tube suture site. Pt on bed rest for one hour with frequent vitals set up.

## 2012-04-11 NOTE — Progress Notes (Signed)
Subjective:  Up in room  Objective:  Vital Signs in the last 24 hours: Temp:  [97.2 F (36.2 C)-99.2 F (37.3 C)] 99.2 F (37.3 C) (09/04 0420) Pulse Rate:  [78-84] 84  (09/04 1004) Resp:  [18-19] 18  (09/04 0420) BP: (138-169)/(74-87) 169/79 mmHg (09/04 1021) SpO2:  [91 %-94 %] 93 % (09/04 0420) Weight:  [111.358 kg (245 lb 8 oz)] 111.358 kg (245 lb 8 oz) (09/04 0420)  Intake/Output from previous day:  Intake/Output Summary (Last 24 hours) at 04/11/12 1151 Last data filed at 04/11/12 0400  Gross per 24 hour  Intake    600 ml  Output   1200 ml  Net   -600 ml    Physical Exam: General appearance: alert, cooperative and no distress Lungs: decreased breath sounds Heart: regular rate and rhythm   Rate: 84  Rhythm: normal sinus rhythm  Lab Results:  Basename 04/11/12 0550  WBC 9.5  HGB 11.4*  PLT 287   No results found for this basename: NA:2,K:2,CL:2,CO2:2,GLUCOSE:2,BUN:2,CREATININE:2 in the last 72 hours No results found for this basename: TROPONINI:2,CK,MB:2 in the last 72 hours Hepatic Function Panel No results found for this basename: PROT,ALBUMIN,AST,ALT,ALKPHOS,BILITOT,BILIDIR,IBILI in the last 72 hours No results found for this basename: CHOL in the last 72 hours No results found for this basename: INR in the last 72 hours  Imaging: Dg Chest 2 View  04/11/2012  *RADIOLOGY REPORT*  Clinical Data: Chest soreness, post CABG.  CHEST - 2 VIEW  Comparison: 04/08/2012.  Findings: Trachea is midline.  Cardiomediastinal silhouette is stable in appearance.  Right IJ catheter or catheter sheath has been removed.  Lungs are somewhat low in volume with bibasilar air space disease and small bilateral pleural effusions, left greater than right.  Flowing anterior osteophytosis in the thoracic spine.  IMPRESSION: Low lung volumes with bibasilar air space disease and small bilateral pleural effusions, left greater than right.   Original Report Authenticated By: Reyes Ivan,  M.D.     Cardiac Studies:  Assessment/Plan:   Principal Problem:  *Unstable angina pectoris - resting chest pain with dynamic Anterior TWI  Active Problems:  CAD, CABG X 3  04/06/2012  Diabetes mellitus type 2 in obese -- on oral medication, but non-compliant  Hypertension  PAF (paroxysmal atrial fibrillation), Amiodarone added post op  Dyslipidemia, low HDL   Plan-Per Dr Laneta Simmers. I spoke with Dr Clarene Duke - start Crestor, ? Xarelto for 6 weeks at discharge for PAF.   Corine Shelter PA-C 04/11/2012, 11:51 AM    Agree with note written by Corine Shelter PAC  POD # 5 CABG X 3. PAF. Progressing OK. Exam benign. Rx per [facility]. On Amio. I don't think he needs Xaralto.  Runell Gess 04/11/2012 4:16 PM

## 2012-04-11 NOTE — Progress Notes (Signed)
Inpatient Diabetes Program Recommendations  AACE/ADA: New Consensus Statement on Inpatient Glycemic Control (2013)  Target Ranges:  Prepandial:   less than 140 mg/dL      Peak postprandial:   less than 180 mg/dL (1-2 hours)      Critically ill patients:  140 - 180 mg/dL   Reason for Visit: Elevated A1C=8.4%.  Would likely benefit from outpatient diabetes education/follow-up.  Will order diabetes "Living well with Diabetes" booklet for patient and discuss with him follow-up regarding diabetes.    He states that he was put on Tradgenta and Metformin about 4 months ago.  Previously he had pre-diabetes.  He see's Dr. Lillie Columbia for diabetes.  Discussed outpatient diabetes education, however he states he would like to wait and discuss with his dr.  If he attends outpatient cardiac rehab, could follow up with the CDE there as well.  Discussed with patient and daughter A1C goals, normal CBG levels, and importance of monitoring.  He does not have meter at home.  Left meter Rx form on shadow chart for MD to sign.

## 2012-04-11 NOTE — Progress Notes (Signed)
CARDIAC REHAB PHASE I   PRE:  Rate/Rhythm: 89SR  BP:  Supine: 148/74  Sitting:   Standing:    SaO2: 91%RA  MODE:  Ambulation: 1780 ft   POST:  Rate/Rhythem: 92SR  BP:  Supine:   Sitting: 157/74  Standing:    SaO2: 94%RA 1340-1405 Pt walked 1780 ft on RA with steady gait. Tolerated well. To recliner after walk. Call bell in reach.  Duanne Limerick

## 2012-04-11 NOTE — Progress Notes (Addendum)
[address]            [place] [address]          [phone]     5 Days Post-Op  Procedure(s) (LRB): CORONARY ARTERY BYPASS GRAFTING (CABG) (N/A) RADIAL ARTERY HARVEST (Left) RADIAL ARTERY HARVEST (Left) Subjective: Feels much better after getting good sleep  Objective  Telemetry sinus rhythm  Temp:  [97.2 F (36.2 C)-99.2 F (37.3 C)] 99.2 F (37.3 C) (09/04 0420) Pulse Rate:  [77-83] 83  (09/04 0420) Resp:  [18-22] 18  (09/04 0420) BP: (138-165)/(72-87) 165/76 mmHg (09/04 0420) SpO2:  [90 %-97 %] 93 % (09/04 0420) Weight:  [245 lb 8 oz (111.358 kg)] 245 lb 8 oz (111.358 kg) (09/04 0420)   Intake/Output Summary (Last 24 hours) at 04/11/12 0735 Last data filed at 04/11/12 0400  Gross per 24 hour  Intake    860 ml  Output   1204 ml  Net   -344 ml       General appearance: alert, cooperative and no distress Heart: regular rate and rhythm and S1, S2 normal Lungs: dim L>R base Abdomen: soft, +BS , nontender Extremities: L arm N/V intact, trace LE edema Wound: incisions healing well  Lab Results: No results found for this basename: NA:2,K:2,CL:2,CO2:2,GLUCOSE:2,BUN:2,CREATININE:2,CALCIUM:2,MG:2,PHOS:2 in the last 72 hours No results found for this basename: AST:2,ALT:2,ALKPHOS:2,BILITOT:2,PROT:2,ALBUMIN:2 in the last 72 hours No results found for this basename: LIPASE:2,AMYLASE:2 in the last 72 hours  Basename 04/11/12 0550  WBC 9.5  NEUTROABS --  HGB 11.4*  HCT 33.5*  MCV 83.1  PLT 287   No results found for this basename: CKTOTAL:4,CKMB:4,TROPONINI:4 in the last 72 hours No components found with this basename: POCBNP:3 No results found for this basename: DDIMER in the last 72 hours No results found for this basename: HGBA1C in the last 72 hours No results found for this basename: CHOL,HDL,LDLCALC,TRIG,CHOLHDL in the last 72 hours No results found for this basename: TSH,T4TOTAL,FREET3,T3FREE,THYROIDAB in the last 72  hours No results found for this basename: VITAMINB12,FOLATE,FERRITIN,TIBC,IRON,RETICCTPCT in the last 72 hours  Medications: Scheduled    . amiodarone  400 mg Oral BID  . aspirin EC  325 mg Oral Daily  . digoxin  0.125 mg Oral Daily  . docusate sodium  200 mg Oral Daily  . enoxaparin (LOVENOX) injection  40 mg Subcutaneous Q24H  . fenofibrate  160 mg Oral Daily  . furosemide  40 mg Oral Daily  . insulin aspart  0-24 Units Subcutaneous TID AC & HS  . isosorbide mononitrate  15 mg Oral Daily  . lactulose  20 g Oral Once  . linagliptin  5 mg Oral Daily  . metFORMIN  850 mg Oral BID WC  . metoprolol tartrate  25 mg Oral TID  . moving right along book   Does not apply Once  . pantoprazole  40 mg Oral Q1200  . potassium chloride SA      . potassium chloride  20 mEq Oral Daily  . sodium chloride  3 mL Intravenous Q12H  . DISCONTD: amiodarone  150 mg Intravenous Once  . DISCONTD: aspirin  324 mg Per Tube Daily  . DISCONTD: bisacodyl  10 mg Oral Daily  . DISCONTD: bisacodyl  10 mg Rectal Daily  . DISCONTD: insulin aspart  0-24 Units Subcutaneous Q4H  . DISCONTD: insulin aspart  0-24 Units Subcutaneous Q4H  . DISCONTD: insulin glargine  15 Units Subcutaneous BID  . DISCONTD: insulin regular  0-10 Units Intravenous TID WC  . DISCONTD: sodium chloride  3 mL Intravenous Q12H     Radiology/Studies:  No results found.  INR: Will add last result for INR, ABG once components are confirmed Will add last 4 CBG results once components are confirmed  Assessment/Plan: S/P Procedure(s) (LRB): CORONARY ARTERY BYPASS GRAFTING (CABG) (N/A) RADIAL ARTERY HARVEST (Left) RADIAL ARTERY HARVEST (Left)  1. Doing well 2. Rhythm stable on current rx 3. recheck cxr, exam c/w mod effusions 4. cbg's with adeq control 5.  push rehab 6. H/h stable  LOS: 7 days    GOLD,WAYNE E 9/4/20137:35 AM    Chart reviewed, patient examined, agree with above. He has maintained sinus rhythm. Will DC pacing  wires and start xarelto tomorrow for 6 weeks. CXR shows low lung volumes, bibasilar atelectasis, small left effusion.  Continue diuretic, IS, ambulation. Possibly home tomorrow if no changes.

## 2012-04-11 NOTE — Progress Notes (Signed)
Pt ambulated approx 1000 feet with no assistance. Pt progressing well.

## 2012-04-12 LAB — GLUCOSE, CAPILLARY: Glucose-Capillary: 178 mg/dL — ABNORMAL HIGH (ref 70–99)

## 2012-04-12 MED ORDER — POTASSIUM CHLORIDE CRYS ER 20 MEQ PO TBCR: 20.0000 meq | EXTENDED_RELEASE_TABLET | Freq: Every day | ORAL | Status: DC

## 2012-04-12 MED ORDER — ISOSORBIDE MONONITRATE 15 MG HALF TABLET: 15.0000 mg | ORAL_TABLET | Freq: Every day | ORAL | Status: DC

## 2012-04-12 MED ORDER — RIVAROXABAN 20 MG PO TABS: 20.0000 mg | ORAL_TABLET | Freq: Every day | ORAL | Status: DC

## 2012-04-12 MED ORDER — METFORMIN HCL 850 MG PO TABS: 850.0000 mg | ORAL_TABLET | Freq: Two times a day (BID) | ORAL | Status: DC

## 2012-04-12 MED ORDER — RIVAROXABAN 10 MG PO TABS: 20.0000 mg | ORAL_TABLET | Freq: Every day | ORAL | Status: DC

## 2012-04-12 MED ORDER — FUROSEMIDE 40 MG PO TABS: 40.0000 mg | ORAL_TABLET | Freq: Every day | ORAL | Status: DC

## 2012-04-12 MED ORDER — LISINOPRIL 40 MG PO TABS: 20.0000 mg | ORAL_TABLET | Freq: Every day | ORAL | Status: DC

## 2012-04-12 MED ORDER — AMIODARONE HCL 400 MG PO TABS: 400.0000 mg | ORAL_TABLET | Freq: Two times a day (BID) | ORAL | Status: DC

## 2012-04-12 MED ORDER — RIVAROXABAN 20 MG PO TABS
20.0000 mg | ORAL_TABLET | Freq: Every day | ORAL | Status: DC
  Filled 2012-04-12: qty 1

## 2012-04-12 MED ORDER — METOPROLOL TARTRATE 50 MG PO TABS: 50.0000 mg | ORAL_TABLET | Freq: Two times a day (BID) | ORAL | Status: DC

## 2012-04-12 MED ORDER — ROSUVASTATIN CALCIUM 20 MG PO TABS: 20.0000 mg | ORAL_TABLET | Freq: Every day | ORAL | Status: DC

## 2012-04-12 MED ORDER — OXYCODONE HCL 5 MG PO TABS: 5.0000 mg | ORAL_TABLET | ORAL | Status: AC | PRN

## 2012-04-12 MED ORDER — DIGOXIN 125 MCG PO TABS: 0.1250 mg | ORAL_TABLET | Freq: Every day | ORAL | Status: DC

## 2012-04-12 MED ORDER — ASPIRIN 325 MG PO TBEC: 325.0000 mg | DELAYED_RELEASE_TABLET | Freq: Every day | ORAL | Status: AC

## 2012-04-12 NOTE — Progress Notes (Signed)
I spoke with Dr Clarene Duke who wants Mr Hohmann to go home on Xarelto for 6 weeks. This has been relayed to [facility]. We will see him in 1-2 weeks in follow up.   Corine Shelter PA-C 04/12/2012 9:12 AM

## 2012-04-12 NOTE — Discharge Summary (Signed)
[address]            [place] [address]          [phone]      Gregory Reese 03/03/1956 56 y.o. 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 Over the past month or so he has been having exertional fatigue as well as numbness down the inner aspect of both arms with generalized weakness in his upper extremities. He was recently fishing in [place] in high temperatures and had repeated episodes of this. Over the past week he has had recurrent episodes at rest sometimes waking him up at night and this is been associated with substernal chest discomfort as well as some tightness in his epigastrium. An ECG showed changes concerning for anterior ischemia. He was admitted this hospitalization for further evaluation and treatment to include cardiac catheterization.  Past Medical History   Diagnosis  Date   .  Kidney stones    .  Hypertension    .  Type II diabetes mellitus     Past Surgical History   Procedure  Date   .  Cardiac catheterization  04/04/2012   .  Stone extraction with basket  [id number]    History reviewed. No pertinent family history.  Social History: reports that he quit smoking about 2 years ago. His smoking use included Cigars. His smokeless tobacco use includes Chew. He reports that he does not drink alcohol or use illicit drugs.  Allergies:  Allergies   Allergen  Reactions   .  Penicillins  Other (See Comments)     "I get dizzy and pass out; pretty close to it"  "ALL THE CILLINS"    Medications:  I have reviewed the patient's current medications.  Prior to Admission:  Prescriptions prior to admission   Medication  Sig  Dispense  Refill    .  aspirin 81 MG tablet  Take 81 mg by mouth daily.     Marland Kitchen  linagliptin (TRADJENTA) 5 MG TABS tablet  Take 5 mg by mouth daily.     Marland Kitchen  lisinopril (PRINIVIL,ZESTRIL) 40 MG tablet  Take 40 mg by mouth daily.     .  metFORMIN (GLUCOPHAGE) 500 MG tablet  Take 500 mg by mouth 2 (two) times daily with a meal.     .  omeprazole (PRILOSEC OTC) 20 MG tablet  Take 20 mg by     Review of Systems : At time of consultation Constitutional: Positive for malaise/fatigue and diaphoresis. Negative for fever, chills and weight loss.  HENT: Negative.  Eyes: Negative.  Respiratory: Negative.  Cardiovascular: Positive for chest pain. Negative for palpitations, orthopnea, leg swelling and PND.  Gastrointestinal: Negative.  Genitourinary: Negative.  Musculoskeletal: Negative.  Skin: Negative.  Neurological: Negative.  Endo/Heme/Allergies: Negative.  Psychiatric/Behavioral: Negative.   Blood pressure 131/55, pulse 58, temperature 97.7 F (36.5 C), temperature source Oral, resp. rate 16, height 6\' 1"  (1.854 m), weight 109.77 kg (242 lb), SpO2 99.00%.  Physical Exam : At time of consultation Constitutional: He is oriented to person, place, and time. He  appears well-developed and well-nourished. No distress.  HENT:  Head: Normocephalic and atraumatic.  Mouth/Throat: Oropharynx is clear and moist.  Eyes: Conjunctivae and EOM are normal. Pupils are equal, round, and reactive to light.  Neck: Normal range of motion. Neck supple. No JVD present. No tracheal deviation present. No thyromegaly present.  Cardiovascular: Normal rate, regular rhythm, normal heart sounds and intact distal pulses. Exam reveals no gallop and no friction rub.  No murmur heard. Strong left radial pulse.  Respiratory: Effort normal and breath sounds normal. No respiratory distress.  GI: Soft. Bowel sounds are normal. He exhibits no distension and no mass. There is no tenderness.  Musculoskeletal: Normal range of motion. He exhibits no edema.   Lymphadenopathy:  He has no cervical adenopathy.  Neurological: He is alert and oriented to person, place, and time. He has normal strength. No cranial nerve deficit or sensory deficit.  Skin: Skin is warm and dry.  Psychiatric: He has a normal mood and affect.    Hospital Course:  The patient underwent cardiac catheterization and was found to have severe single-vessel coronary artery disease of the LAD/diagonal system. Due to the severity of the anatomic findings he was felt to be most suitable for coronary artery bypass grafting as his best revascularization option. He was seen in cardiothoracic surgical consultation by Evelene Croon M.D. who evaluated the patient and his studies and agreed with recommendations. The surgery was then scheduled. On 04/06/2012 he was taken the operating room at which time he underwent the following procedure:    OPERATIVE REPORT  PREOPERATIVE DIAGNOSIS: Severe single-vessel coronary artery disease  with unstable angina.  POSTOPERATIVE DIAGNOSIS: Severe single-vessel coronary artery disease  with unstable angina.  OPERATIVE PROCEDURE: Median sternotomy, extracorporeal circulation,  coronary artery bypass graft surgery x3 using a sequential left radial  artery graft to the first and second diagonal branches of the left  anterior descending artery, and a left internal mammary artery graft to  the left anterior descending coronary artery.  ATTENDING SURGEON: Evelene Croon, MD  ASSISTANT: Doree Fudge, PA-C  ANESTHESIA: General endotracheal. The patient remained hemodynamically stable and was transported to the SICU in guarded, but stable condition.  Post operative hospital course:  The patient has overall progressed nicely. He was weaned from the ventilator without significant difficulty. He maintained stable hemodynamics however did have postoperative atrial fibrillation with rapid ventricular response. He was started on amiodarone and digoxin.  Additionally Lopressor was used for heart rate control. He has subsequently been chemically cardioverted to normal sinus rhythm. All routine lines, monitors and drainage devices have been discontinued in the standard fashion. He has an expected acute blood loss anemia and values have stabilized. Diabetes has been under good control using standard glue, and her protocols with subsequent transition to his oral medication regimen. He is tolerating gradually increasing activities using standard cardiac rehabilitation protocols. Incisions are healing well without evidence of infection and the left arm radial artery harvest site remains neurovascularly intact. He has some moderate postoperative volume overload which is responding well to diuretics which will continue for a short-term as an outpatient. Currently his status is felt to be quite stable for discharge to home.      No results found for this basename: NA:2,K:2,CL:2,CO2:2,GLUCOSE:2,BUN:2,CRATININE:2,CALCIUM:2 in the last 72 hours  Basename 04/11/12 0550  WBC 9.5  HGB 11.4*  HCT 33.5*  PLT 287   No results found for this basename: INR:2 in the last 72 hours   Discharge Instructions:  The patient is  discharged to home with extensive instructions on wound care and progressive ambulation.  They are instructed not to drive or perform any heavy lifting until returning to see the physician in his office.  Discharge Diagnosis:  Chest pain CAD Expected acute blood loss anemia  Secondary Diagnosis: Patient Active Problem List  Diagnosis  . Unstable angina pectoris - resting chest pain with dynamic Anterior TWI  . Diabetes mellitus type 2 in obese -- on oral medication, but non-compliant  . Hypertension  . CAD, CABG X 3  04/06/2012  . PAF (paroxysmal atrial fibrillation), Amiodarone added post op   Past Medical History  Diagnosis Date  . Kidney stones   . Hypertension   . Type II diabetes mellitus        Gregory Reese, Gregory Reese  Home  Medication Instructions [id number]   Printed on:04/12/12 9562  Medication Information                    omeprazole (PRILOSEC OTC) 20 MG tablet Take 20 mg by mouth daily.           linagliptin (TRADJENTA) 5 MG TABS tablet Take 5 mg by mouth daily.           amiodarone (PACERONE) 400 MG tablet Take 1 tablet (400 mg total) by mouth 2 (two) times daily. For 7 days , then 400 mg daily           digoxin (LANOXIN) 0.125 MG tablet Take 1 tablet (0.125 mg total) by mouth daily.           furosemide (LASIX) 40 MG tablet Take 1 tablet (40 mg total) by mouth daily.           isosorbide mononitrate (IMDUR) 15 mg TB24 Take 0.5 tablets (15 mg total) by mouth daily.           lisinopril (PRINIVIL,ZESTRIL) 40 MG tablet Take 0.5 tablets (20 mg total) by mouth daily.           metFORMIN (GLUCOPHAGE) 850 MG tablet Take 1 tablet (850 mg total) by mouth 2 (two) times daily with a meal.           metoprolol (LOPRESSOR) 50 MG tablet Take 1 tablet (50 mg total) by mouth 2 (two) times daily.           oxyCODONE (OXY IR/ROXICODONE) 5 MG immediate release tablet Take 1-2 tablets (5-10 mg total) by mouth every 4 (four) hours as needed.           potassium chloride SA (K-DUR,KLOR-CON) 20 MEQ tablet Take 1 tablet (20 mEq total) by mouth daily.           rosuvastatin (CRESTOR) 20 MG tablet Take 1 tablet (20 mg total) by mouth daily.           aspirin EC 325 MG EC tablet Take 1 tablet (325 mg total) by mouth daily.             Disposition: Discharged home  Patient's condition is Good  Gershon Crane, PA-C 04/12/2012  8:29 AM

## 2012-04-12 NOTE — Progress Notes (Signed)
[address]            [place] [address]          [phone]     6 Days Post-Op  Procedure(s) (LRB): CORONARY ARTERY BYPASS GRAFTING (CABG) (N/A) RADIAL ARTERY HARVEST (Left) RADIAL ARTERY HARVEST (Left) Subjective: conts to feel well, some mld nausea  Objective  Telemetry sinus rhythm, sinus tachy  Temp:  [98 F (36.7 C)-99.1 F (37.3 C)] 98 F (36.7 C) (09/05 0438) Pulse Rate:  [75-85] 75  (09/05 0438) Resp:  [19-20] 19  (09/05 0438) BP: (141-175)/(65-80) 150/72 mmHg (09/05 0438) SpO2:  [91 %-96 %] 91 % (09/05 0438) Weight:  [238 lb 14.4 oz (108.364 kg)] 238 lb 14.4 oz (108.364 kg) (09/05 0438)   Intake/Output Summary (Last 24 hours) at 04/12/12 0716 Last data filed at 04/12/12 0300  Gross per 24 hour  Intake    720 ml  Output   1725 ml  Net  -1005 ml       General appearance: alert, cooperative and no distress Heart: regular rate and rhythm and S1, S2 normal Lungs: diminished in bases Abdomen: benign  Extremities: no edema Wound: incisions healing well  Lab Results: No results found for this basename: NA:2,K:2,CL:2,CO2:2,GLUCOSE:2,BUN:2,CREATININE:2,CALCIUM:2,MG:2,PHOS:2 in the last 72 hours No results found for this basename: AST:2,ALT:2,ALKPHOS:2,BILITOT:2,PROT:2,ALBUMIN:2 in the last 72 hours No results found for this basename: LIPASE:2,AMYLASE:2 in the last 72 hours  Basename 04/11/12 0550  WBC 9.5  NEUTROABS --  HGB 11.4*  HCT 33.5*  MCV 83.1  PLT 287   No results found for this basename: CKTOTAL:4,CKMB:4,TROPONINI:4 in the last 72 hours No components found with this basename: POCBNP:3 No results found for this basename: DDIMER in the last 72 hours No results found for this basename: HGBA1C in the last 72 hours No results found for this basename: CHOL,HDL,LDLCALC,TRIG,CHOLHDL in the last 72 hours No results found for this basename: TSH,T4TOTAL,FREET3,T3FREE,THYROIDAB in the last 72 hours No results found for  this basename: VITAMINB12,FOLATE,FERRITIN,TIBC,IRON,RETICCTPCT in the last 72 hours  Medications: Scheduled    . amiodarone  400 mg Oral BID  . aspirin EC  325 mg Oral Daily  . atorvastatin  10 mg Oral q1800  . digoxin  0.125 mg Oral Daily  . docusate sodium  200 mg Oral Daily  . enoxaparin (LOVENOX) injection  40 mg Subcutaneous Q24H  . fenofibrate  160 mg Oral Daily  . furosemide  40 mg Oral BID  . insulin aspart  0-24 Units Subcutaneous TID AC & HS  . isosorbide mononitrate  15 mg Oral Daily  . linagliptin  5 mg Oral Daily  . living well with diabetes book   Does not apply Once  . metFORMIN  850 mg Oral BID WC  . metoprolol tartrate  50 mg Oral BID  . pantoprazole  40 mg Oral Q1200  . potassium chloride  20 mEq Oral BID  . sodium chloride  3 mL Intravenous Q12H  . DISCONTD: furosemide  40 mg Oral Daily  . DISCONTD: metoprolol tartrate  25 mg Oral TID  . DISCONTD: potassium chloride  20 mEq Oral Daily     Radiology/Studies:  Dg Chest 2 View  04/11/2012  *RADIOLOGY REPORT*  Clinical Data: Chest soreness, post CABG.  CHEST - 2 VIEW  Comparison: 04/08/2012.  Findings: Trachea is midline.  Cardiomediastinal silhouette is stable in appearance.  Right IJ catheter or  catheter sheath has been removed.  Lungs are somewhat low in volume with bibasilar air space disease and small bilateral pleural effusions, left greater than right.  Flowing anterior osteophytosis in the thoracic spine.  IMPRESSION: Low lung volumes with bibasilar air space disease and small bilateral pleural effusions, left greater than right.   Original Report Authenticated By: Reyes Ivan, M.D.     INR: Will add last result for INR, ABG once components are confirmed Will add last 4 CBG results once components are confirmed  Assessment/Plan: S/P Procedure(s) (LRB): CORONARY ARTERY BYPASS GRAFTING (CABG) (N/A) RADIAL ARTERY HARVEST (Left) RADIAL ARTERY HARVEST (Left)  1. Doing well, will restart lisinopril at  half home dose for HBP 2 rhythm stable 3 cbg's adequate control 4 appears stable for d/c   LOS: 8 days    Jasmynn Pfalzgraf E 9/5/20137:16 AM

## 2012-04-12 NOTE — Progress Notes (Signed)
[id number] Education completed with pt and wife. Discussed not chewing and gave smoking cessation handouts. Discussed CRP2 and pt gave permission to refer to [facility]. Encouraged better adherence of diabetic diet. Nader Boys DunlapRN

## 2012-04-13 MED FILL — Lidocaine HCl IV Inj 20 MG/ML: INTRAVENOUS | Qty: 5 | Status: AC

## 2012-04-13 MED FILL — Sodium Bicarbonate IV Soln 8.4%: INTRAVENOUS | Qty: 50 | Status: AC

## 2012-04-13 MED FILL — Mannitol IV Soln 20%: INTRAVENOUS | Qty: 500 | Status: AC

## 2012-04-13 MED FILL — Heparin Sodium (Porcine) Inj 1000 Unit/ML: INTRAMUSCULAR | Qty: 30 | Status: AC

## 2012-04-13 MED FILL — Heparin Sodium (Porcine) Inj 1000 Unit/ML: INTRAMUSCULAR | Qty: 10 | Status: AC

## 2012-04-13 MED FILL — Sodium Chloride Irrigation Soln 0.9%: Qty: 3000 | Status: AC

## 2012-04-13 MED FILL — Sodium Chloride IV Soln 0.9%: INTRAVENOUS | Qty: 1000 | Status: AC

## 2012-04-13 MED FILL — Electrolyte-R (PH 7.4) Solution: INTRAVENOUS | Qty: 5000 | Status: AC

## 2012-04-16 LAB — POCT I-STAT, CHEM 8
BUN: 13 mg/dL (ref 6–23)
Creatinine, Ser: 0.9 mg/dL (ref 0.50–1.35)
Potassium: 4 mEq/L (ref 3.5–5.1)
Sodium: 140 mEq/L (ref 135–145)

## 2012-04-17 ENCOUNTER — Encounter: Payer: Self-pay | Admitting: Surgery

## 2012-05-04 ENCOUNTER — Other Ambulatory Visit: Payer: Self-pay | Admitting: Surgery

## 2012-05-04 DIAGNOSIS — I2510 Atherosclerotic heart disease of native coronary artery without angina pectoris: Secondary | ICD-10-CM

## 2012-05-07 ENCOUNTER — Ambulatory Visit
Admission: RE | Admit: 2012-05-07 | Discharge: 2012-05-07 | Disposition: A | Discharge status: Home / Self Care | Payer: BC Managed Care – PPO | Source: Ambulatory Visit | Attending: Surgery | Admitting: Surgery

## 2012-05-07 ENCOUNTER — Ambulatory Visit (INDEPENDENT_AMBULATORY_CARE_PROVIDER_SITE_OTHER): Payer: Self-pay | Admitting: Physician Assistant

## 2012-05-07 VITALS — BP 126/83 | HR 74 | Resp 18 | Ht 73.0 in | Wt 235.0 lb

## 2012-05-07 DIAGNOSIS — Z951 Presence of aortocoronary bypass graft: Secondary | ICD-10-CM

## 2012-05-07 DIAGNOSIS — J90 Pleural effusion, not elsewhere classified: Secondary | ICD-10-CM

## 2012-05-07 DIAGNOSIS — I2510 Atherosclerotic heart disease of native coronary artery without angina pectoris: Secondary | ICD-10-CM

## 2012-05-07 MED ORDER — FUROSEMIDE 40 MG PO TABS: 40.0000 mg | ORAL_TABLET | Freq: Every day | ORAL | Status: DC

## 2012-05-07 MED ORDER — POTASSIUM CHLORIDE CRYS ER 20 MEQ PO TBCR: 20.0000 meq | EXTENDED_RELEASE_TABLET | Freq: Every day | ORAL | Status: DC

## 2012-05-07 NOTE — Progress Notes (Signed)
[address]            [place] [address]          [phone]     HPI: Patient returns for routine postoperative follow-up having undergone CABG x 3 (LIMA- LAD, left radial artery - first and second diagonal) by Dr. Laneta Simmers on 04/06/2012 .  The patient's postoperative course was notable for atrial fibrillation, which was treated with Lopressor, Amiodarone and Digoxin.  He was also started on Xarelto prior to discharge.  He was discharged home in good condition on 04/12/2012.  Since hospital discharge, the patient has progressed well.  He does complain of some point tenderness at the site of the mammary artery harvest, but overall his pain is well controlled.  He also complains of some shortness of breath, both at rest and with activity.  He denies any lower extremity edema.  His appetite is good, and he is walking daily.  He was seen in followup in Dr. Fredirick Maudlin office, and was found to be doing well.  He remains in sinus rhythm, and his Digoxin was stopped.  His Amiodarone was decreased to 200 mg daily, and he was continued on Xarelto, to complete a 6 week course.      Current Outpatient Prescriptions  Medication Sig Dispense Refill  . amiodarone (PACERONE) 400 MG tablet Take 400 mg by mouth daily. For 7 days , then 400 mg daily      . aspirin 325 MG EC tablet Take 325 mg by mouth daily.      Marland Kitchen atorvastatin (LIPITOR) 20 MG tablet Take 20 mg by mouth daily.      . isosorbide mononitrate (IMDUR) 15 mg TB24 Take 0.5 tablets (15 mg total) by mouth daily.  30 each  0  . linagliptin (TRADJENTA) 5 MG TABS tablet Take 5 mg by mouth daily.      Marland Kitchen lisinopril (PRINIVIL,ZESTRIL) 40 MG tablet Take 0.5 tablets (20 mg total) by mouth daily.  30 tablet  1  . metFORMIN (GLUCOPHAGE) 850 MG tablet Take 1 tablet (850 mg total) by mouth 2 (two) times daily with a meal.  60 tablet  1  . metoprolol (LOPRESSOR) 50 MG tablet Take 1 tablet (50 mg total) by mouth 2 (two) times daily.   60 tablet  1  . omeprazole (PRILOSEC OTC) 20 MG tablet Take 20 mg by mouth daily.      Marland Kitchen oxycodone (OXY-IR) 5 MG capsule Take 5 mg by mouth every 4 (four) hours as needed.      . Rivaroxaban (XARELTO) 20 MG TABS Take 1 tablet (20 mg total) by mouth daily with supper. For 6 weeks total  30 tablet  1  . DISCONTD: amiodarone (PACERONE) 400 MG tablet Take 1 tablet (400 mg total) by mouth 2 (two) times daily. For 7 days , then 400 mg daily  70 tablet  1    Physical Exam: BP 126/83 HR 74 Resp 18 Wounds: Sternal wound and left radial artery harvest site have healed very well.  Sternum is stable to palpation. Heart: RRR Lungs: breath sounds diminished in left base Extremities: No lower extremity edema   Diagnostic Tests: Chest xray: Findings: There is slightly more opacity at the left lung base most consistent with slight increase in left pleural effusion and left basilar atelectasis. Only a tiny right pleural effusion remains. The lungs appear better aerated.  Cardiomegaly is stable.  IMPRESSION:  1. Slight increase in opacity at the left lung base most  consistent with effusion and atelectasis.  2. Somewhat improved aeration.    Assessment/Plan: Gregory Reese is overall doing well, status post CABG.  He continues to have a small left pleural effusion, which has changed little since he went home.  He completed a 7 day course of Lasix.  I have elected to continue Lasix 40 mg daily and KCl 20 meq daily for an additional 10 days since he is symptomatic.  Otherwise, he is progressing as expected.  He has 2 more doses of Imdur for his radial artery graft to complete a 30 day supply.  After these are completed, he may discontinue the medication.  He will follow up with [facility] regarding his anti-arrhythmic medication.  We will see him back in 2 weeks with a repeat chest x-ray to re-evaluate his effusion.  He may call if experiences any worsening symptoms in the interim.

## 2012-05-08 ENCOUNTER — Ambulatory Visit: Payer: BC Managed Care – PPO | Admitting: Surgery

## 2012-05-21 ENCOUNTER — Other Ambulatory Visit: Payer: Self-pay | Admitting: Surgery

## 2012-05-21 DIAGNOSIS — Z951 Presence of aortocoronary bypass graft: Secondary | ICD-10-CM

## 2012-05-22 ENCOUNTER — Ambulatory Visit
Admission: RE | Admit: 2012-05-22 | Discharge: 2012-05-22 | Disposition: A | Discharge status: Home / Self Care | Payer: BC Managed Care – PPO | Source: Ambulatory Visit | Attending: Surgery | Admitting: Surgery

## 2012-05-22 ENCOUNTER — Encounter: Payer: Self-pay | Admitting: Surgery

## 2012-05-22 ENCOUNTER — Ambulatory Visit (INDEPENDENT_AMBULATORY_CARE_PROVIDER_SITE_OTHER): Payer: Self-pay | Admitting: Surgery

## 2012-05-22 VITALS — BP 134/81 | HR 68 | Resp 16 | Ht 73.0 in | Wt 235.0 lb

## 2012-05-22 DIAGNOSIS — I2510 Atherosclerotic heart disease of native coronary artery without angina pectoris: Secondary | ICD-10-CM

## 2012-05-22 DIAGNOSIS — Z951 Presence of aortocoronary bypass graft: Secondary | ICD-10-CM

## 2012-05-22 NOTE — Progress Notes (Signed)
                   [address]            [name] [address]          [phone]       HPI:  Patient returns for routine postoperative follow-up having undergone coronary artery bypass x 3 using a sequential left radial artery graft and a left internal mammary graft on 04/06/2012. The patient's early postoperative recovery while in the hospital was notable for postop atrial fibrillation. He was converted with amiodarone,metoprolol and digoxin. He was started on Xarelto for a planned 6 week course. He recently began having some hematuria which resolved after he stopped the Xarelto. He started back on it a few days ago. He has noted no irregular rhythms or tachycardia. He has been active and has had no dyspnea or chest pain. He has mild incisional soreness at the bottom of the sternotomy incision. He has returned to chewing tobacco.    Current Outpatient Prescriptions  Medication Sig Dispense Refill  . amiodarone (PACERONE) 400 MG tablet Take 400 mg by mouth daily. For 7 days , then 400 mg daily      . aspirin 325 MG EC tablet Take 325 mg by mouth daily.      Marland Kitchen atorvastatin (LIPITOR) 20 MG tablet Take 20 mg by mouth daily.      Marland Kitchen linagliptin (TRADJENTA) 5 MG TABS tablet Take 5 mg by mouth daily.      Marland Kitchen lisinopril (PRINIVIL,ZESTRIL) 40 MG tablet Take 0.5 tablets (20 mg total) by mouth daily.  30 tablet  1  . metFORMIN (GLUCOPHAGE) 850 MG tablet Take 1 tablet (850 mg total) by mouth 2 (two) times daily with a meal.  60 tablet  1  . metoprolol (LOPRESSOR) 50 MG tablet Take 1 tablet (50 mg total) by mouth 2 (two) times daily.  60 tablet  1  . omeprazole (PRILOSEC OTC) 20 MG tablet Take 20 mg by mouth daily.      . Rivaroxaban (XARELTO) 20 MG TABS Take 1 tablet (20 mg total) by mouth daily with supper. For 6 weeks total  30 tablet  1    Physical Exam:  BP 134/81  Pulse 68  Resp 16  Ht 6\' 1"  (1.854 m)  Wt 235 lb (106.595 kg)  BMI 31.00 kg/m2  SpO2 96% He looks  well. Lung exam reveals decreased breath sounds over the left lower lobe. Cardiac exam reveals a regular rate and rhythm with normal heart sounds. The chest incision is healing well and the sternum is stable. The left arm incision is healing well. The hand is neurovascularly intact.  Diagnostic Tests:  CXR today shows persistent opacification at the left base which is probably a combination of effusion and atelectasis. It looks stable to slightly increased from last cxr done 2 weeks ago.  Impression:  Overall he seems to be making good progress. He denies any symptoms or signs of atrial fibrillation. He is 6 weeks postoperatively so I think we can stop his Xarelto and amiodarone. He does have persistent left lower lobe atelectasis and left pleural effusion. I will plan to see him back in about 2 weeks and repeat his chest x-ray. If this is not improved I would plan to do an ultrasound-guided thoracentesis.  Plan: I'll see him back in 2 weeks with a repeat chest x-ray.

## 2012-05-31 ENCOUNTER — Other Ambulatory Visit: Payer: Self-pay | Admitting: Surgery

## 2012-05-31 DIAGNOSIS — Z951 Presence of aortocoronary bypass graft: Secondary | ICD-10-CM

## 2012-06-05 ENCOUNTER — Encounter: Payer: Self-pay | Admitting: Surgery

## 2012-06-05 ENCOUNTER — Ambulatory Visit (INDEPENDENT_AMBULATORY_CARE_PROVIDER_SITE_OTHER): Payer: Self-pay | Admitting: Surgery

## 2012-06-05 ENCOUNTER — Ambulatory Visit
Admission: RE | Admit: 2012-06-05 | Discharge: 2012-06-05 | Disposition: A | Discharge status: Home / Self Care | Payer: BC Managed Care – PPO | Source: Ambulatory Visit | Attending: Surgery | Admitting: Surgery

## 2012-06-05 VITALS — BP 119/76 | HR 74 | Resp 20 | Ht 73.0 in | Wt 235.0 lb

## 2012-06-05 DIAGNOSIS — J90 Pleural effusion, not elsewhere classified: Secondary | ICD-10-CM

## 2012-06-05 DIAGNOSIS — Z951 Presence of aortocoronary bypass graft: Secondary | ICD-10-CM

## 2012-06-05 DIAGNOSIS — I2510 Atherosclerotic heart disease of native coronary artery without angina pectoris: Secondary | ICD-10-CM

## 2012-06-05 MED ORDER — METOPROLOL TARTRATE 50 MG PO TABS: 50.0000 mg | ORAL_TABLET | Freq: Two times a day (BID) | ORAL | Status: DC

## 2012-06-05 MED ORDER — METFORMIN HCL 850 MG PO TABS: 850.0000 mg | ORAL_TABLET | Freq: Two times a day (BID) | ORAL | Status: AC

## 2012-06-05 NOTE — Progress Notes (Signed)
                    [address]            [name] [address]          [phone]      HPI: The patient returns today for followup of postoperative left pleural effusion status post coronary bypass graft surgery on 04/06/2012. He continues to feel well without shortness of breath. He has been walking daily and is trying to his heart rate up. He has no cough or sputum production.  Current Outpatient Prescriptions  Medication Sig Dispense Refill  . aspirin 325 MG EC tablet Take 325 mg by mouth daily.      Marland Kitchen atorvastatin (LIPITOR) 20 MG tablet Take 20 mg by mouth daily.      Marland Kitchen linagliptin (TRADJENTA) 5 MG TABS tablet Take 5 mg by mouth daily.      Marland Kitchen lisinopril (PRINIVIL,ZESTRIL) 40 MG tablet Take 0.5 tablets (20 mg total) by mouth daily.  30 tablet  1  . metFORMIN (GLUCOPHAGE) 850 MG tablet Take 1 tablet (850 mg total) by mouth 2 (two) times daily with a meal.  60 tablet  1  . metoprolol (LOPRESSOR) 50 MG tablet Take 1 tablet (50 mg total) by mouth 2 (two) times daily.  60 tablet  1  . omeprazole (PRILOSEC OTC) 20 MG tablet Take 20 mg by mouth daily.      Marland Kitchen amiodarone (PACERONE) 400 MG tablet Take 400 mg by mouth daily. For 7 days , then 400 mg daily         Physical Exam: BP 119/76  Pulse 74  Resp 20  Ht 6\' 1"  (1.854 m)  Wt 235 lb (106.595 kg)  BMI 31.00 kg/m2  SpO2 94% She looks well. Cardiac exam shows a regular rate and rhythm with normal heart sounds. Lung exam reveals decreased breath sounds at the left base. The chest incision is healing well and sternum is stable. There is no peripheral edema.  Diagnostic Tests:  *RADIOLOGY REPORT*   Clinical Data: CABG times three   CHEST - 2 VIEW   Comparison: 05/22/2012   Findings: Previous median sternotomy and CABG procedure.  Continued moderate left pleural effusion.  No interstitial edema.  No airspace consolidation.   IMPRESSION:   1.  No significant change in left pleural effusion     Original  Report Authenticated By: Rosealee Albee, M.D.     Impression:  I reviewed his chest x-ray and I think the left pleural effusion is slightly smaller than it was  2 weeks ago. The radiologist said that it had not changed much but I think it has decreased a little. He remains asymptomatic and has been active. I think we should continue watching this for another month or so and repeat his chest x-ray at that time to see if the effusion continues to resolve on its own.  Plan:  He will return to see me in 1 month with a chest x-ray.

## 2012-06-29 ENCOUNTER — Other Ambulatory Visit: Payer: Self-pay | Admitting: Surgery

## 2012-06-29 DIAGNOSIS — Z951 Presence of aortocoronary bypass graft: Secondary | ICD-10-CM

## 2012-07-03 ENCOUNTER — Ambulatory Visit
Admission: RE | Admit: 2012-07-03 | Discharge: 2012-07-03 | Disposition: A | Discharge status: Home / Self Care | Payer: BC Managed Care – PPO | Source: Ambulatory Visit | Attending: Surgery | Admitting: Surgery

## 2012-07-03 ENCOUNTER — Encounter: Payer: Self-pay | Admitting: Surgery

## 2012-07-03 ENCOUNTER — Ambulatory Visit (INDEPENDENT_AMBULATORY_CARE_PROVIDER_SITE_OTHER): Payer: Self-pay | Admitting: Surgery

## 2012-07-03 VITALS — BP 128/62 | HR 74 | Resp 18 | Ht 73.0 in | Wt 240.0 lb

## 2012-07-03 DIAGNOSIS — Z951 Presence of aortocoronary bypass graft: Secondary | ICD-10-CM

## 2012-07-03 DIAGNOSIS — J90 Pleural effusion, not elsewhere classified: Secondary | ICD-10-CM

## 2012-07-03 DIAGNOSIS — I2510 Atherosclerotic heart disease of native coronary artery without angina pectoris: Secondary | ICD-10-CM

## 2012-07-03 NOTE — Progress Notes (Signed)
                    [address]            [name] [address]          [phone]      HPI:  Gregory Reese returns today for followup of a postoperative left pleural effusion status post coronary bypass graft surgery on 04/06/2012. He continues to feel well without shortness of breath. She has been watching his diet closely. He continues to walk at least 5 days per week.  Current Outpatient Prescriptions  Medication Sig Dispense Refill  . aspirin 325 MG EC tablet Take 325 mg by mouth daily.      Marland Kitchen atorvastatin (LIPITOR) 20 MG tablet Take 20 mg by mouth daily.      Marland Kitchen linagliptin (TRADJENTA) 5 MG TABS tablet Take 5 mg by mouth daily.      Marland Kitchen lisinopril (PRINIVIL,ZESTRIL) 40 MG tablet Take 0.5 tablets (20 mg total) by mouth daily.  30 tablet  1  . metFORMIN (GLUCOPHAGE) 850 MG tablet Take 1 tablet (850 mg total) by mouth 2 (two) times daily with a meal.  60 tablet  1  . metoprolol (LOPRESSOR) 50 MG tablet Take 1 tablet (50 mg total) by mouth 2 (two) times daily.  60 tablet  1  . omeprazole (PRILOSEC OTC) 20 MG tablet Take 20 mg by mouth daily.         Physical Exam: BP 128/62  Pulse 74  Resp 18  Ht 6\' 1"  (1.854 m)  Wt 240 lb (108.863 kg)  BMI 31.66 kg/m2  SpO2 95% He looks well. Cardiac exam shows a regular rate and rhythm with normal heart sounds. Lung exam reveals slight decreased breath sounds at left base. Chest incision is well-healed and sternum is stable. There is no peripheral edema.  Diagnostic Tests:  *RADIOLOGY REPORT*   Clinical Data: Bypass surgery.   CHEST - 2 VIEW   Comparison: 06/05/2012.   Findings: Interval reduction of left pleural effusion and left lower lobe atelectasis.  The cardiac silhouette, mediastinal and hilar contours are stable.  The right lung is clear.   IMPRESSION: Resolving left pleural effusion and left lower lobe atelectasis.     Original Report Authenticated By: Rudie Meyer, M.D.   Impression:  He is making a  good recovery following coronary bypass graft surgery. The postoperative left pleural effusion continues to resolve on its own. It is now small. I don't think any further followup is needed. I renewed prescriptions today for metoprolol 50 mg by mouth twice a day and Lipitor 20 mg by mouth daily.  Plan:  He will continue followup with his primary physician as well as Dr. Herbie Baltimore for his cardiology care. He will return to see me if he develops any problems with his incisions.

## 2012-07-16 ENCOUNTER — Other Ambulatory Visit (HOSPITAL_COMMUNITY): Payer: Self-pay | Admitting: Cardiology

## 2012-07-16 DIAGNOSIS — I2510 Atherosclerotic heart disease of native coronary artery without angina pectoris: Secondary | ICD-10-CM

## 2012-07-16 DIAGNOSIS — Z951 Presence of aortocoronary bypass graft: Secondary | ICD-10-CM

## 2012-07-24 ENCOUNTER — Ambulatory Visit (HOSPITAL_COMMUNITY): Payer: BC Managed Care – PPO

## 2012-07-30 ENCOUNTER — Ambulatory Visit (HOSPITAL_COMMUNITY)
Admission: RE | Admit: 2012-07-30 | Discharge: 2012-07-30 | Disposition: A | Discharge status: Home / Self Care | Payer: BC Managed Care – PPO | Source: Ambulatory Visit | Attending: Cardiology | Admitting: Cardiology

## 2012-07-30 DIAGNOSIS — Z9861 Coronary angioplasty status: Secondary | ICD-10-CM | POA: Insufficient documentation

## 2012-07-30 DIAGNOSIS — I2510 Atherosclerotic heart disease of native coronary artery without angina pectoris: Secondary | ICD-10-CM | POA: Insufficient documentation

## 2012-07-30 DIAGNOSIS — Z951 Presence of aortocoronary bypass graft: Secondary | ICD-10-CM

## 2012-07-30 HISTORY — PX: DOPPLER ECHOCARDIOGRAPHY: SHX263

## 2012-07-30 NOTE — Progress Notes (Signed)
[facility]   2D echo completed 07/30/2012.   Cindy Cintya Daughety, RDCS   

## 2012-12-09 ENCOUNTER — Other Ambulatory Visit: Payer: Self-pay | Admitting: Surgery

## 2013-01-23 ENCOUNTER — Telehealth: Payer: Self-pay | Admitting: Cardiology

## 2013-01-23 DIAGNOSIS — Z951 Presence of aortocoronary bypass graft: Secondary | ICD-10-CM

## 2013-01-23 NOTE — Telephone Encounter (Signed)
Just talked to Dr Jennye Boroughs told him to call you and get you to call his Metoprolol  50mg -Call To [facility]- in [facility]!D

## 2013-01-24 MED ORDER — METOPROLOL TARTRATE 50 MG PO TABS: 50.0000 mg | ORAL_TABLET | Freq: Two times a day (BID) | ORAL | Status: DC

## 2013-01-24 NOTE — Telephone Encounter (Signed)
Refill sent to [facility] in [place]

## 2013-02-27 ENCOUNTER — Ambulatory Visit (INDEPENDENT_AMBULATORY_CARE_PROVIDER_SITE_OTHER): Payer: BC Managed Care – PPO | Admitting: Cardiology

## 2013-02-27 ENCOUNTER — Encounter: Payer: Self-pay | Admitting: Cardiology

## 2013-02-27 VITALS — BP 130/80 | HR 76 | Ht 73.0 in | Wt 202.6 lb

## 2013-02-27 DIAGNOSIS — E8881 Metabolic syndrome: Secondary | ICD-10-CM

## 2013-02-27 DIAGNOSIS — Z7189 Other specified counseling: Secondary | ICD-10-CM

## 2013-02-27 DIAGNOSIS — R5383 Other fatigue: Secondary | ICD-10-CM | POA: Insufficient documentation

## 2013-02-27 DIAGNOSIS — E669 Obesity, unspecified: Secondary | ICD-10-CM

## 2013-02-27 DIAGNOSIS — I2510 Atherosclerotic heart disease of native coronary artery without angina pectoris: Secondary | ICD-10-CM

## 2013-02-27 DIAGNOSIS — F17200 Nicotine dependence, unspecified, uncomplicated: Secondary | ICD-10-CM

## 2013-02-27 DIAGNOSIS — R5381 Other malaise: Secondary | ICD-10-CM

## 2013-02-27 DIAGNOSIS — E1169 Type 2 diabetes mellitus with other specified complication: Secondary | ICD-10-CM

## 2013-02-27 DIAGNOSIS — E785 Hyperlipidemia, unspecified: Secondary | ICD-10-CM

## 2013-02-27 DIAGNOSIS — Z951 Presence of aortocoronary bypass graft: Secondary | ICD-10-CM

## 2013-02-27 DIAGNOSIS — Z720 Tobacco use: Secondary | ICD-10-CM

## 2013-02-27 DIAGNOSIS — I10 Essential (primary) hypertension: Secondary | ICD-10-CM

## 2013-02-27 DIAGNOSIS — Z716 Tobacco abuse counseling: Secondary | ICD-10-CM

## 2013-02-27 DIAGNOSIS — E119 Type 2 diabetes mellitus without complications: Secondary | ICD-10-CM

## 2013-02-27 MED ORDER — NIACIN ER (ANTIHYPERLIPIDEMIC) 500 MG PO TBCR: 500.0000 mg | EXTENDED_RELEASE_TABLET | Freq: Every day | ORAL | Status: AC

## 2013-02-27 NOTE — Patient Instructions (Addendum)
Stop Metoprolol start Bystolic 2.5 mg daily Stop Atorvastatin, Start  Niaspan 500mg  at bedtime take Aspirin 30 MIN prior to taking NAISPAN Decrease Lisinopril 20 MG (1/2 tablet of 40 mg)  Have labs drawn  Your physician recommends that you schedule a follow-up appointment in 6 month  6 month

## 2013-03-15 ENCOUNTER — Telehealth: Payer: Self-pay | Admitting: *Deleted

## 2013-03-15 ENCOUNTER — Encounter: Payer: Self-pay | Admitting: Cardiology

## 2013-03-15 DIAGNOSIS — Z720 Tobacco use: Secondary | ICD-10-CM | POA: Insufficient documentation

## 2013-03-15 DIAGNOSIS — Z951 Presence of aortocoronary bypass graft: Secondary | ICD-10-CM | POA: Insufficient documentation

## 2013-03-15 DIAGNOSIS — Z716 Tobacco abuse counseling: Secondary | ICD-10-CM | POA: Insufficient documentation

## 2013-03-15 MED ORDER — NEBIVOLOL HCL 2.5 MG PO TABS: 2.5000 mg | ORAL_TABLET | Freq: Every day | ORAL | Status: DC

## 2013-03-15 NOTE — Assessment & Plan Note (Addendum)
This may be multifactorial, but I am worried that maybe themyalgias could be making Lipitor a culprit.   There is lots of the things they can be.  Plan: Multiple labs as described below in the order section.  One LAD I did not order that I would like to is a CK level to determine if there is any myalgias/myositis from Lipitor.  DC Lipitor,  Convert from metoprolol to Bystolic for less side effect profile

## 2013-03-15 NOTE — Assessment & Plan Note (Signed)
With low HDL, and high triglycerides: As we are stopping his Lipitor, I will initiate Niaspan 500 mg daily  We'll need lipid panel rechecked in roughly 3-4 months.

## 2013-03-15 NOTE — Assessment & Plan Note (Signed)
No more symptoms of unstable angina.  No heart failure symptoms.  He seems to be recovering from the surgery at this time.  These issue is that he is not feeling up to doing the exercises that he should be doing.  Plan: Continue aspirin, could reduce to 81 mg.  On ACE inhibitor beta blocker and statin, but we are stopping the statin due to possible myalgias from it.  If that is the case, I would try either pravastatin or rosuvastatin, as these have a lower side effect profile.

## 2013-03-15 NOTE — Assessment & Plan Note (Signed)
Combination of hypertension, and dyslipidemia with low HDL and high triglycerides mixed 3/5 diagnoses 4 metabolic syndrome.  Plan: Continue aggressive risk factor modification with lipid control and tight blood pressure control.

## 2013-03-15 NOTE — Assessment & Plan Note (Signed)
With changing her metoprolol to Bystolic, I don't want to have him become hypotensive.  We'll switch his lisinopril 20 mg each bedtime until he is rechecked.  Plan: DC metoprolol tartrate, change to Bystolic 2.5 mg daily

## 2013-03-15 NOTE — Telephone Encounter (Signed)
Message copied by Tobin Chad on Fri Mar 15, 2013  6:00 PM ------      Message from: [facility], Griselda      Created: Fri Mar 15, 2013 12:27 PM       Jasmine December -- I see that mr. Kagan has yet to get labs done.            I had left 1 lab out -- so if he goes, that can be checked as well.            Also, I am not sure if he got a Rx for Bystolic 2.5 mg -- I just finished his note & did the order, so it should be int he pharmacy.            The plan was to d/c Metoprolol & switch out for Bystolic.              Marykay Lex, MD            If Jasmine December is already gone, can one of the other RNs check on this for me.  I am going out of town.  My note explains it.             ------

## 2013-03-15 NOTE — Assessment & Plan Note (Signed)
I tried to him about smoking cessation, he did not seem imaged in that conversation at this time.  We'll readdress in the future

## 2013-03-15 NOTE — Progress Notes (Signed)
Patient ID: Gregory Reese, male   DOB: 05-27-1956, 57 y.o.   MRN: [id number] PCP: Judene Companion, MD  Clinic Note: Chief Complaint  Patient presents with  . 10 month visit    no chest ,no sob,no edema, stopped atorvastatin @3  weeks ago bad muscle , FEELING FATIGUE WITH ONR OF THE MEDS   HPI: Gregory Reese is a 57 y.o. male with a PMH below who presents today for almost 1 year followup. He was a patient of Dr. Julieanne Manson, who was referred to me for cardiac catheterization due to symptoms concerning for unstable angina with biphasic T waves in the leads.  Cardiac catheterization showed a severe double bifurcation LAD-D1-D2 disease, reviewing the images with Dr. Clarene Duke, we decided that it was best served by surgical revascularization.  He was operated on by Dr. Juel Burrow later that month in August 2013 with 3 vessel CABG, all arterial grafts.  He was initially doing quite well afterwards.  He did have postop A. fib which is now resolved and is off amiodarone.  Interval History: He presents today, mostly concerned with a sense of generalized fatigue and muscle aching.  He also has mild, intermittent palpitations but nothing that would be suggestive of his postop A. Fib. He denies any chest pain or shortness of breath with exertion, but is just not feeling up to doing them out exercise used to do.  He denies any melena, hematochezia or hematuria.  He denies any claudication symptoms.  No TIA, amaurosis fugax symptoms.  No lightheadedness, dizziness, wooziness, syncope /near syncope.  Past Medical History  Diagnosis Date  . CAD (coronary artery disease), native coronary artery August 2013    Severe stenosis of the LAD involving D1 and D2; not good PCI option due to length and double bifurcation.  Normal EF by cath  . H/O unstable angina August 2013    Referred for cardiac catheterization, resulting in CABG x3  . S/P CABG x 3     LIMA-LAD, free left Radial-D1-D2 sequential  . Diabetes  mellitus type II, non insulin dependent   . Dyslipidemia     Low HDL  . Metabolic syndrome   . Postoperative atrial fibrillation 04/11/2012    From CABG, resolved  . Nephrolithiasis   . Hypertension     Prior Cardiac Evaluation and Past Surgical History: Past Surgical History  Procedure Laterality Date  . Cardiac catheterization  04/04/2012    Severe LAD-D1-D2 disease; would require long stent and double bifurcation PCI/PTCA with 2 large diagonal branches being jailed  . Stone extraction with basket  [id number]  . Coronary artery bypass graft  04/06/2012    Procedure: CORONARY ARTERY BYPASS GRAFTING (CABG);  Surgeon: Alleen Borne, MD;  [facility];  Service: Open Heart Surgery;  Laterality: N/A;  . Radial artery harvest  04/06/2012    Procedure: RADIAL ARTERY HARVEST;  Surgeon: Alleen Borne, MD;  Location: [facility] OR;  Service: Open Heart Surgery;  Laterality: Left;  . Radial artery harvest  04/06/2012    Procedure: RADIAL ARTERY HARVEST;  Surgeon: Alleen Borne, MD;  Location: [facility] OR;  Service: Vascular;  Laterality: Left;    Allergies  Allergen Reactions  . Amoclan (Amoxicillin-Pot Clavulanate)   . Penicillins Other (See Comments)    "I get dizzy and pass out; pretty close to it" "ALL THE CILLINS"    Current Outpatient Prescriptions  Medication Sig Dispense Refill  . aspirin 325 MG EC tablet Take 325 mg by  mouth daily.      Marland Kitchen linagliptin (TRADJENTA) 5 MG TABS tablet Take 5 mg by mouth daily.      Marland Kitchen lisinopril (PRINIVIL,ZESTRIL) 40 MG tablet Take 40 mg by mouth daily.      . metFORMIN (GLUCOPHAGE) 850 MG tablet Take 1 tablet (850 mg total) by mouth 2 (two) times daily with a meal.  60 tablet  1  . omeprazole (PRILOSEC OTC) 20 MG tablet TAKE 1/2 TABLET A DAY      . atorvastatin (LIPITOR) 20 MG tablet Take 20 mg by mouth daily.      . nebivolol (BYSTOLIC) 2.5 MG tablet Take 1 tablet (2.5 mg total) by mouth daily.  30 tablet  6  . niacin (NIASPAN) 500 MG CR tablet Take 1 tablet (500  mg total) by mouth at bedtime.  90 tablet  3   No current facility-administered medications for this visit.    History   Social History  . Marital Status: Married    Spouse Name: N/A    Number of Children: N/A  . Years of Education: N/A   Occupational History  . Not on file.   Social History Main Topics  . Smoking status: Former Smoker -- 20 years    Types: Cigars    Quit date: 10/06/2009  . Smokeless tobacco: Current User    Types: Chew     Comment: 04/04/2012 offered cessation materials; pt declines  . Alcohol Use: No  . Drug Use: No  . Sexually Active: Not Currently   Other Topics Concern  . Not on file   Social History Narrative    He is a married father of 2. He apparently chews tobacco. Does not drink alcohol.  He has been walking 30 minutes 6 to 7 days a week, walking about a mile to a mile and a half. He quit smoking cigars about 2+ years ago. 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    ROS: A comprehensive Review of Systems - Negative except Pertinent positives noted in history of present illness  PHYSICAL EXAM BP 130/80  Pulse 76  Ht 6\' 1"  (1.854 m)  Wt 202 lb 9.6 oz (91.899 kg)  BMI 26.74 kg/m2 General appearance: alert, cooperative, appears stated age, no distress and Healthy-appearing but somewhat tired.  Answers questions properly.  Well-nourished and well-groomed. HEENT: [place]/AT, EOMI, MMM, anicteric sclera Neck: no adenopathy, no carotid bruit and no JVD Lungs: clear to auscultation bilaterally, normal percussion bilaterally and Nonlabored, good air movement Heart: regular rate and rhythm, S1, S2 normal, no murmur, click, rub or gallop and normal apical impulse Abdomen: soft, non-tender; bowel sounds normal; no masses,  no organomegaly Extremities: extremities normal, atraumatic, no cyanosis or edema, no edema, redness or tenderness in the calves or thighs, no ulcers, gangrene or trophic changes and Well-healed scar on the  left wrist from radial artery extraction Pulses: 2+ and symmetric -- with the obvious exception of the left radial artery Neurologic: Grossly normal  [id number] today: Yes Rate: 76 , Rhythm: Normal sinus rhythm, normal ECG.;   Recent Labs: None check  ASSESSMENT / PLAN:   CAD, CABG X 3  04/06/2012 No more symptoms of unstable angina.  No heart failure symptoms.  He seems to be recovering from the surgery at this time.  These issue is that he is not feeling up to doing the exercises that he should be doing.  Plan: Continue aspirin, could reduce to 81 mg.  On ACE  inhibitor beta blocker and statin, but we are stopping the statin due to possible myalgias from it.  If that is the case, I would try either pravastatin or rosuvastatin, as these have a lower side effect profile.  Fatigue This may be multifactorial, but I am worried that maybe themyalgias could be making Lipitor a culprit.   There is lots of the things they can be.  Plan: Multiple labs as described below in the order section.  One LAD I did not order that I would like to is a CK level to determine if there is any myalgias/myositis from Lipitor.  DC Lipitor,  Convert from metoprolol to Bystolic for less side effect profile    Dyslipidemia With low HDL, and high triglycerides: As we are stopping his Lipitor, I will initiate Niaspan 500 mg daily  We'll need lipid panel rechecked in roughly 3-4 months.  Hypertension With changing her metoprolol to Bystolic, I don't want to have him become hypotensive.  We'll switch his lisinopril 20 mg each bedtime until he is rechecked.  Plan: DC metoprolol tartrate, change to Bystolic 2.5 mg daily  Metabolic syndrome Combination of hypertension, and dyslipidemia with low HDL and high triglycerides mixed 3/5 diagnoses 4 metabolic syndrome.  Plan: Continue aggressive risk factor modification with lipid control and tight blood pressure control.  Tobacco abuse I tried to him about  smoking cessation, he did not seem imaged in that conversation at this time.  We'll readdress in the future      Stop Metoprolol start Bystolic 2.5 mg daily Stop Atorvastatin, Start  Niaspan 500mg  at bedtime take Aspirin 30 MIN prior to taking NAISPAN Decrease Lisinopril 20 MG (1/2 tablet of 40 mg)  Have labs drawn  Orders Placed This Encounter  Procedures  . Hemoglobin A1C  . Lipid panel  . Comprehensive metabolic panel  . CBC  . TSH  . Vitamin B1  . Vitamin D 1,25 dihydroxy  . Testosterone  . CK (Creatine Kinase)  . EKG 12-Lead   Meds ordered this encounter  Medications  . lisinopril (PRINIVIL,ZESTRIL) 40 MG tablet    Sig: Take 40 mg by mouth daily.  Marland Kitchen DISCONTD: metoprolol (LOPRESSOR) 50 MG tablet    Sig: Take 25 mg by mouth 2 (two) times daily.  . niacin (NIASPAN) 500 MG CR tablet    Sig: Take 1 tablet (500 mg total) by mouth at bedtime.    Dispense:  90 tablet    Refill:  3  . nebivolol (BYSTOLIC) 2.5 MG tablet    Sig: Take 1 tablet (2.5 mg total) by mouth daily.    Dispense:  30 tablet    Refill:  6    Followup: 6 months  Gregory Reese, M.D., M.S. [facility] [address] [place], [place]  [address]  [phone] Pager # [phone]

## 2013-03-15 NOTE — Telephone Encounter (Signed)
Spoke to patient.He states he has not had his lab drawn yet. Instruction given to him to wait until he received another labslip to take with him. Placed in mail. Verbalized understanding.   He thinks BYSTOLICI is doing okay. He will call when he needs refill.

## 2013-05-03 ENCOUNTER — Encounter: Payer: Self-pay | Admitting: Cardiology

## 2013-07-09 ENCOUNTER — Other Ambulatory Visit: Payer: Self-pay | Admitting: *Deleted

## 2013-07-09 DIAGNOSIS — I10 Essential (primary) hypertension: Secondary | ICD-10-CM

## 2013-07-09 DIAGNOSIS — R5383 Other fatigue: Secondary | ICD-10-CM

## 2013-07-09 DIAGNOSIS — E8881 Metabolic syndrome: Secondary | ICD-10-CM

## 2013-07-09 MED ORDER — NEBIVOLOL HCL 2.5 MG PO TABS: 2.5000 mg | ORAL_TABLET | Freq: Every day | ORAL | Status: DC

## 2013-08-19 ENCOUNTER — Ambulatory Visit (INDEPENDENT_AMBULATORY_CARE_PROVIDER_SITE_OTHER): Payer: BC Managed Care – PPO | Admitting: Cardiology

## 2013-08-19 VITALS — BP 150/80 | HR 65 | Ht 73.0 in | Wt 254.0 lb

## 2013-08-19 DIAGNOSIS — I10 Essential (primary) hypertension: Secondary | ICD-10-CM

## 2013-08-19 NOTE — Patient Instructions (Addendum)
Continue taking Lisinopril 40 mg daily Increase Bystolic to 5 mg daily Continue to check BP every day until follow-up twice a day. Keep a record and bring back to follow-up appointment. Follow-up in 1-2 weeks

## 2013-08-20 ENCOUNTER — Encounter: Payer: Self-pay | Admitting: Cardiology

## 2013-08-20 NOTE — Assessment & Plan Note (Signed)
Poorly controlled over the last several days. No symptoms. There appears to be room with HR to try to increase his Bystolic to 5 mg daily. Will continue on 40 mg of Lisinopril daily. Will reassess in 2 weeks.

## 2013-08-20 NOTE — Progress Notes (Signed)
Patient ID: Gregory Reese, male   DOB: [birth year], 58 y.o.   MRN: [id number]  08/20/2013 Gregory Reese   13-Feb-1956  [id number]  Primary Physicia LITTLE, Thereasa Solo, MD Primary Cardiologist: Dr. Clarene Duke  HPI:  The patient is a 58 y/o male, followed by Dr. Herbie Baltimore, with a history of CAD, s/p CABG x 3 in 2013, HTN, DLD and DM. He presents to clinic today for evaluation of hypertension. He states that he has checked his BP over the last several days and noticed that his BP has been high, with systolic pressures in the 180s - 200s. He does admit that he ran out of his Bystolic yesterday. He continued to take 40 mg of Lisinopril but has continued to have high blood pressure. He states that his pressures were high, even with taking both his Bystolic and Lisinopril together. He denies any recent history of smoking. He does admit that his diet is poorly controlled and that this may also be a contributing factor. He denies any symptoms related to his hypertension, noting no chest pain/pressure/tightness, no SOB, HA, dizziness or visual changes.   Before coming to the office his SBP was ~180. On arrival, his BP had improved to 150/80. He remaines asymptomatic.   Current Outpatient Prescriptions  Medication Sig Dispense Refill  . aspirin 325 MG EC tablet Take 325 mg by mouth daily.      Marland Kitchen atorvastatin (LIPITOR) 20 MG tablet Take 20 mg by mouth daily.      Marland Kitchen linagliptin (TRADJENTA) 5 MG TABS tablet Take 5 mg by mouth daily.      Marland Kitchen lisinopril (PRINIVIL,ZESTRIL) 40 MG tablet Take 40 mg by mouth daily.      . nebivolol (BYSTOLIC) 2.5 MG tablet Take 1 tablet (2.5 mg total) by mouth daily.  30 tablet  6  . niacin (NIASPAN) 500 MG CR tablet Take 1 tablet (500 mg total) by mouth at bedtime.  90 tablet  3  . omeprazole (PRILOSEC OTC) 20 MG tablet TAKE 1/2 TABLET A DAY      . metFORMIN (GLUCOPHAGE) 850 MG tablet Take 1 tablet (850 mg total) by mouth 2 (two) times daily with a meal.  60 tablet  1   No current  facility-administered medications for this visit.    Allergies  Allergen Reactions  . Amoclan [Amoxicillin-Pot Clavulanate]   . Penicillins Other (See Comments)    "I get dizzy and pass out; pretty close to it" "ALL THE CILLINS"    History   Social History  . Marital Status: Married    Spouse Name: N/A    Number of Children: N/A  . Years of Education: N/A   Occupational History  . Not on file.   Social History Main Topics  . Smoking status: Former Smoker -- 20 years    Types: Cigars    Quit date: 10/06/2009  . Smokeless tobacco: Current User    Types: Chew     Comment: 04/04/2012 offered cessation materials; pt declines  . Alcohol Use: No  . Drug Use: No  . Sexual Activity: Not Currently   Other Topics Concern  . Not on file   Social History Narrative    He is a married father of 2. He apparently chews tobacco. Does not drink alcohol.  He has been walking 30 minutes 6 to 7 days a week, walking about a mile to a mile and a half. He quit smoking cigars about 2+ years ago. 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     Review of Systems: General: negative for chills, fever, night sweats or weight changes.  Cardiovascular: negative for chest pain, dyspnea on exertion, edema, orthopnea, palpitations, paroxysmal nocturnal dyspnea or shortness of breath Dermatological: negative for rash Respiratory: negative for cough or wheezing Urologic: negative for hematuria Abdominal: negative for nausea, vomiting, diarrhea, bright red blood per rectum, melena, or hematemesis Neurologic: negative for visual changes, syncope, or dizziness All other systems reviewed and are otherwise negative except as noted above.    Blood pressure 150/80, pulse 65, height 6\' 1"  (1.854 m), weight 254 lb (115.214 kg).  General appearance: alert, cooperative and no distress Neck: no carotid bruit and no JVD Lungs: clear to auscultation bilaterally Heart: regular rate  and rhythm, S1, S2 normal, no murmur, click, rub or gallop Extremities: no LEE Pulses: 2+ and symmetric Skin: warm and dry Neurologic: Grossly normal  EKG NSR. HR 67 bpm. No ischemic changes.   ASSESSMENT AND PLAN:   Hypertension Poorly controlled over the last several days. No symptoms. There appears to be room with HR to try to increase his Bystolic to 5 mg daily. Will continue on 40 mg of Lisinopril daily. Will reassess in 2 weeks.     PLAN  58 y/o male with h/o CAD, s/p CABG x 3 in 2013, HTN, DLD and DM, who has been hypertensive for the last several days, despite reporting being compliant with his Lisinopril and Bystolic. He has been on 40 mg of Lisinopril and 2.5 mg of Bystolic. His BP is improved on office physical exam at 150/80. His HR is 67 bpm. We will increase his Bystolic from 2.5 to 5 mg daily. We will try this temporarily and will have him follow-up in 1-2 weeks to see if his BP is better controlled. We will also reassess his HR to see if he remains stable with the increase in his BB. He was instructed to continue to check BP daily until seen in f/u and to keep a log and to bring back to his next visit. He was instructed to decrease his Bystolic back down to 2.5 mg if he becomes hypotensive, bradycardic of if he develops any symptoms of dizziness, lightheadedness, syncope/ near syncope. He verbalized understanding. We also discussed the importance of better diet and increase in physical activity to help lower BP, improve DM and lower cholesterol.     SIMMONS, BRITTAINYPA-C 08/20/2013 10:29 AM

## 2013-08-21 ENCOUNTER — Encounter: Payer: Self-pay | Admitting: Cardiology

## 2013-10-15 IMAGING — CR DG CHEST 1V PORT
1 series · 1 of 1 positions shown · non-contrast
Comparison: Two-view chest x-ray yesterday and 09/09/2008.

CLINICAL DATA: Postop CABG.

PORTABLE CHEST - 1 VIEW [DATE]/[id number] hours:

[AP]
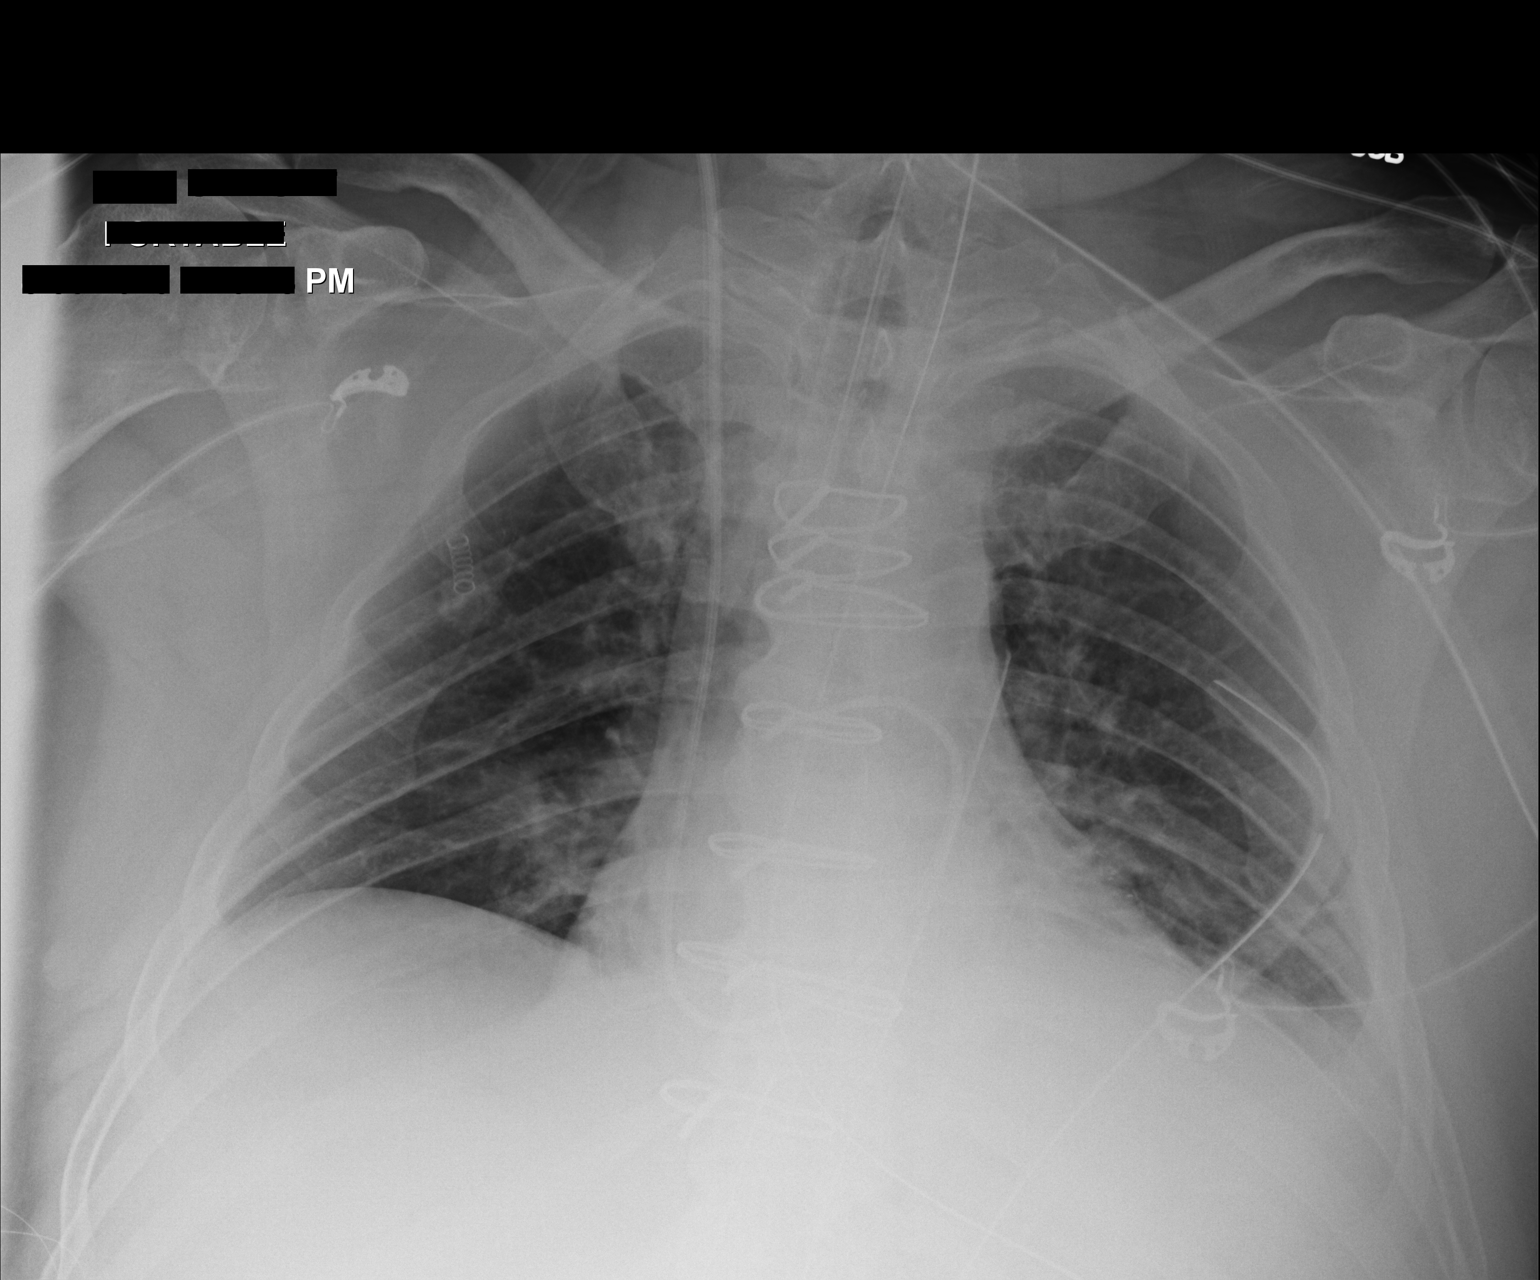

[1 of 1 positions shown; findings below may reference images not displayed]

FINDINGS: Sternotomy for CABG.  Endotracheal tube tip in
satisfactory position projecting approximately 5 cm above the
carina.  Swan-Ganz catheter tip projects over the right main
pulmonary artery.  Left chest tubes in place with no pneumothorax.
Cardiac silhouette upper normal in size to slightly enlarged.
Pulmonary vascularity normal.  Dense atelectasis in the left lower
lobe.  Lungs otherwise clear.
IMPRESSION: Support apparatus satisfactory.  No pneumothorax.  Left lower lobe
atelectasis post CABG.

## 2013-10-16 IMAGING — CR DG CHEST 1V PORT
1 series · 1 of 1 positions shown · non-contrast
Comparison: Yesterday.

CLINICAL DATA: Status post open heart surgery yesterday.

PORTABLE CHEST - 1 VIEW

[AP]
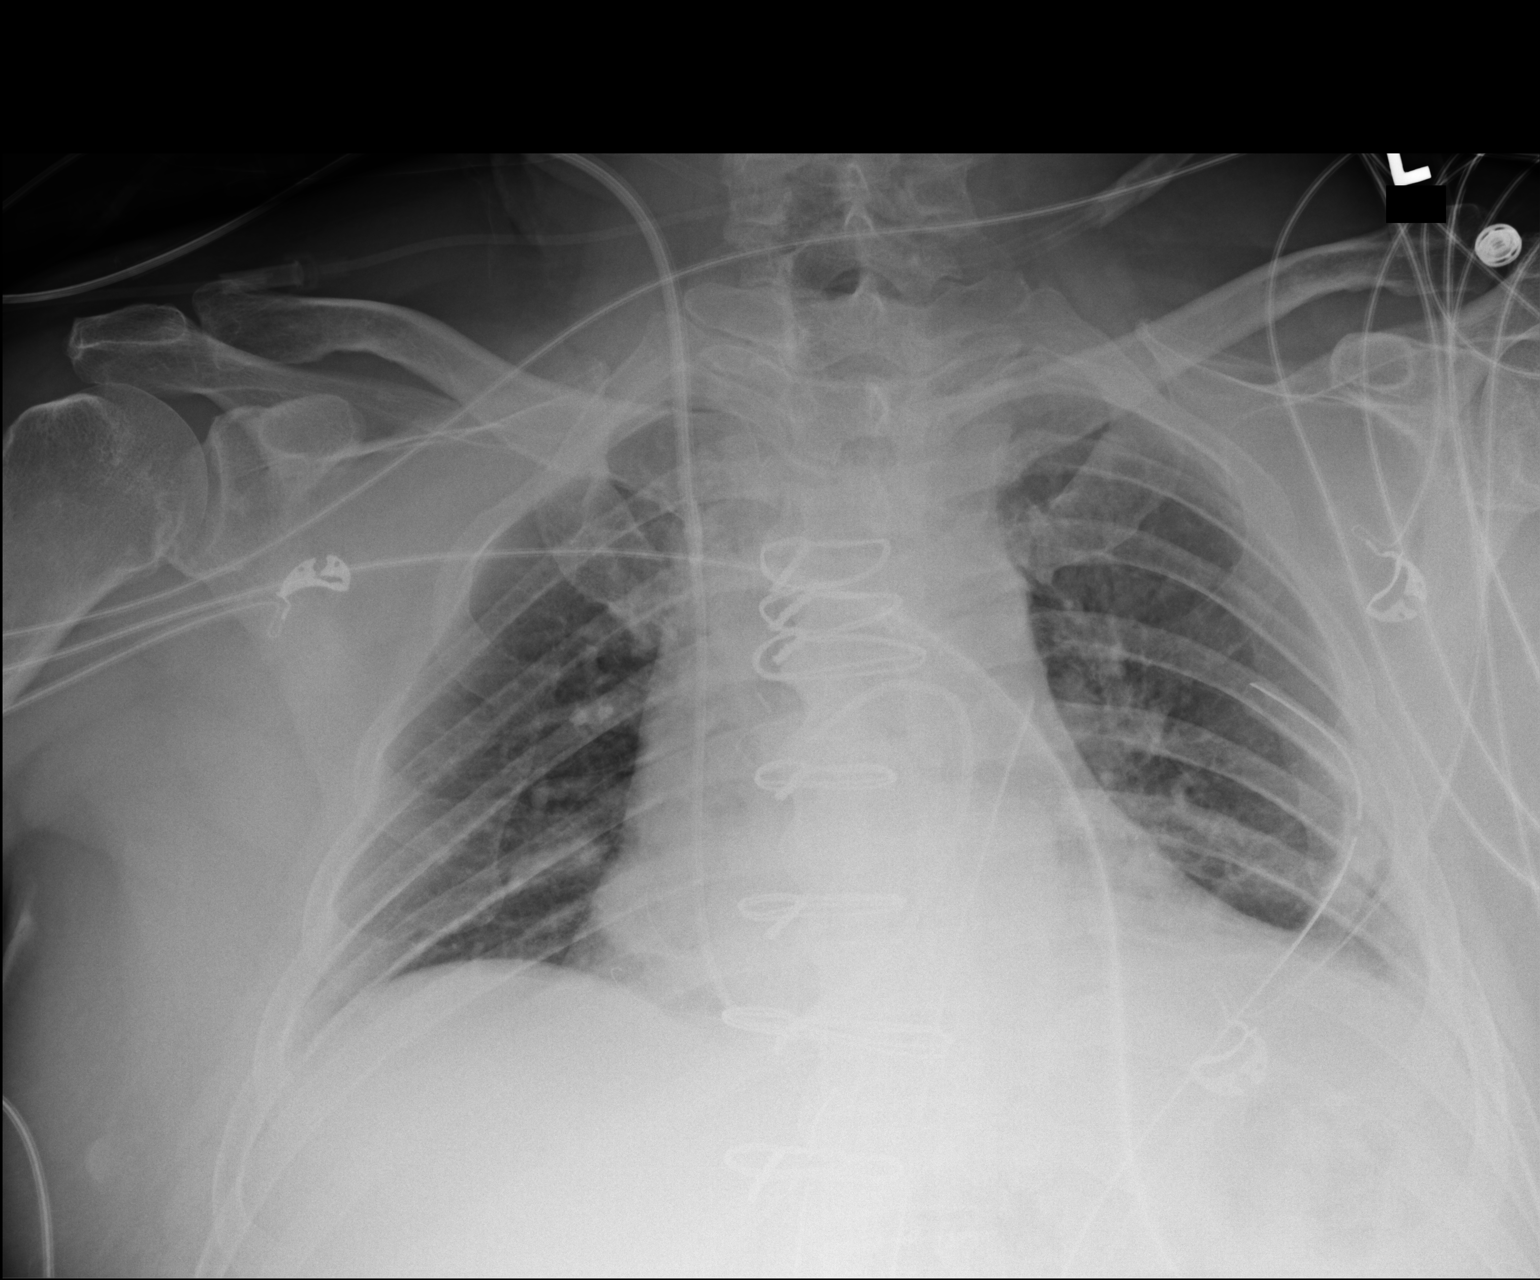

[1 of 1 positions shown; findings below may reference images not displayed]

FINDINGS: The endotracheal and nasogastric tubes have been removed.
The right jugular Swan-Ganz catheter tip is in the right main
pulmonary artery.  Mediastinal and left chest tubes remain in place
without pneumothorax.  Stable borderline enlarged cardiac
silhouette and mild left lower lobe airspace opacity.  Stable post
CABG changes.
IMPRESSION: Stable borderline cardiomegaly and mild left lower lobe
atelectasis.

## 2013-10-17 IMAGING — CR DG CHEST 1V PORT
1 series · 1 of 1 positions shown · non-contrast
Comparison: 1 day prior

CLINICAL DATA: Postop for cardiac surgery.

PORTABLE CHEST - 1 VIEW

[AP]
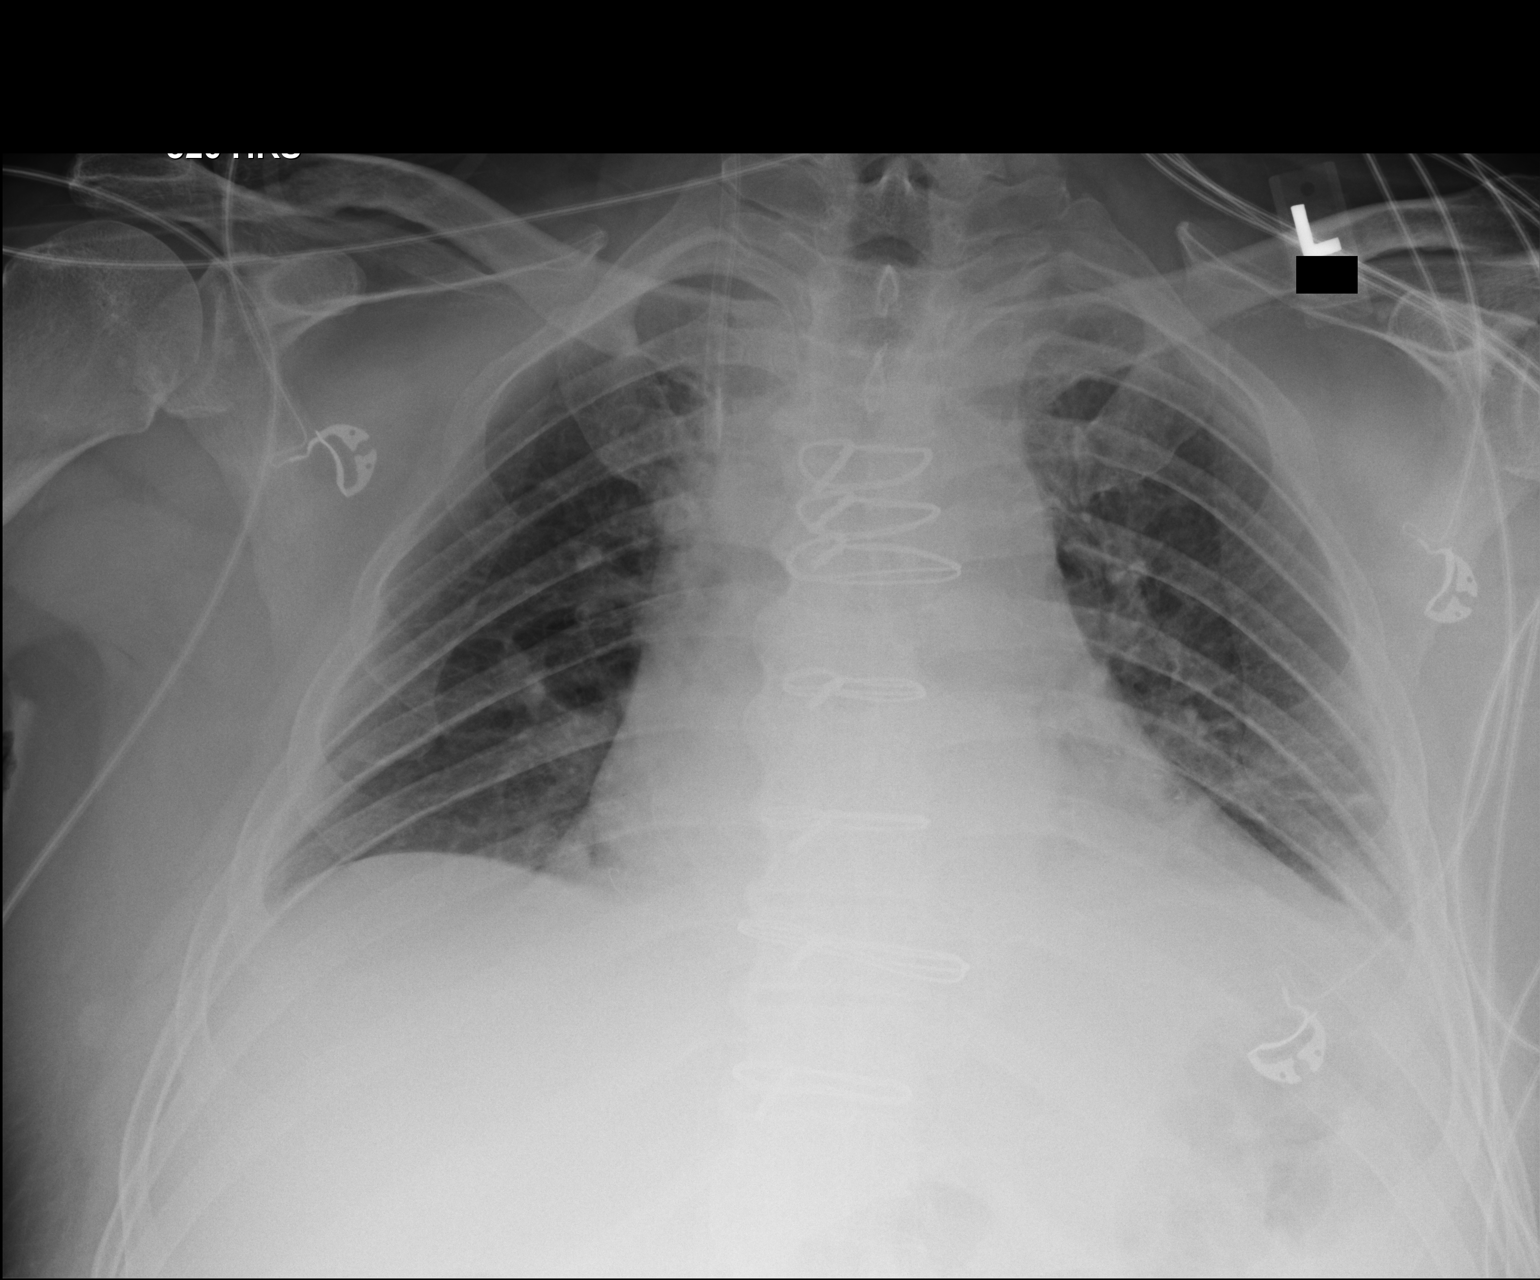

[1 of 1 positions shown; findings below may reference images not displayed]

FINDINGS: Removal of Swan-Ganz catheter with right IJ Cordis sheath
remaining in place. Prior median sternotomy.  Removal of 2 left
chest tubes, without pneumothorax.

Mild cardiomegaly.  Probable small left pleural effusion. No
congestive failure.

Low lung volumes.  Patchy left base atelectasis.
IMPRESSION: 1.  Removal of Swan-Ganz catheter and left chest tubes, without
pneumothorax.
2.  Similar small left pleural effusion with adjacent atelectasis.

## 2013-10-20 IMAGING — CR DG CHEST 2V
2 series · 2 of 2 positions shown · non-contrast
Comparison: 04/08/2012.

CLINICAL DATA: Chest soreness, post CABG.

CHEST - 2 VIEW

[w chest pa]
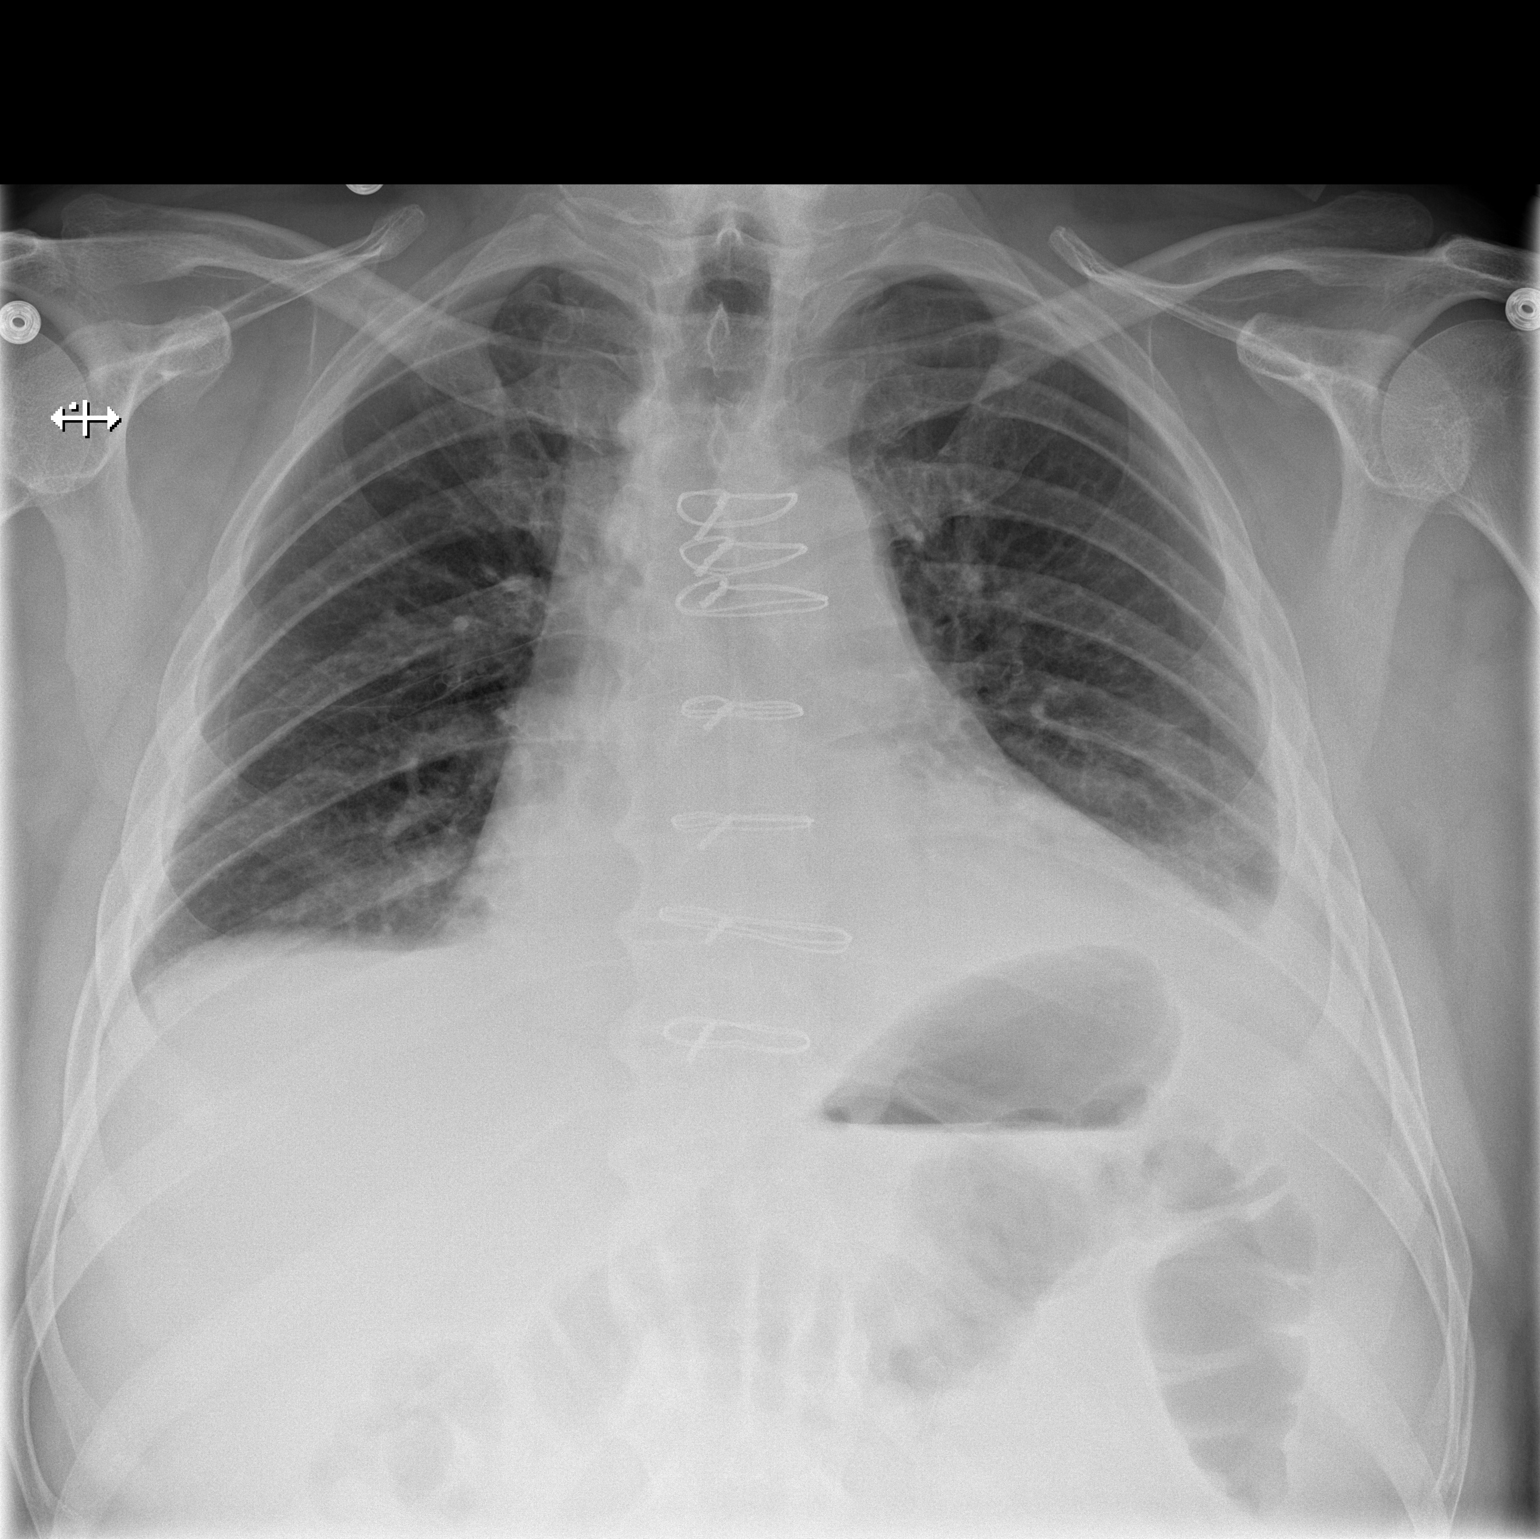

[w chest lat]
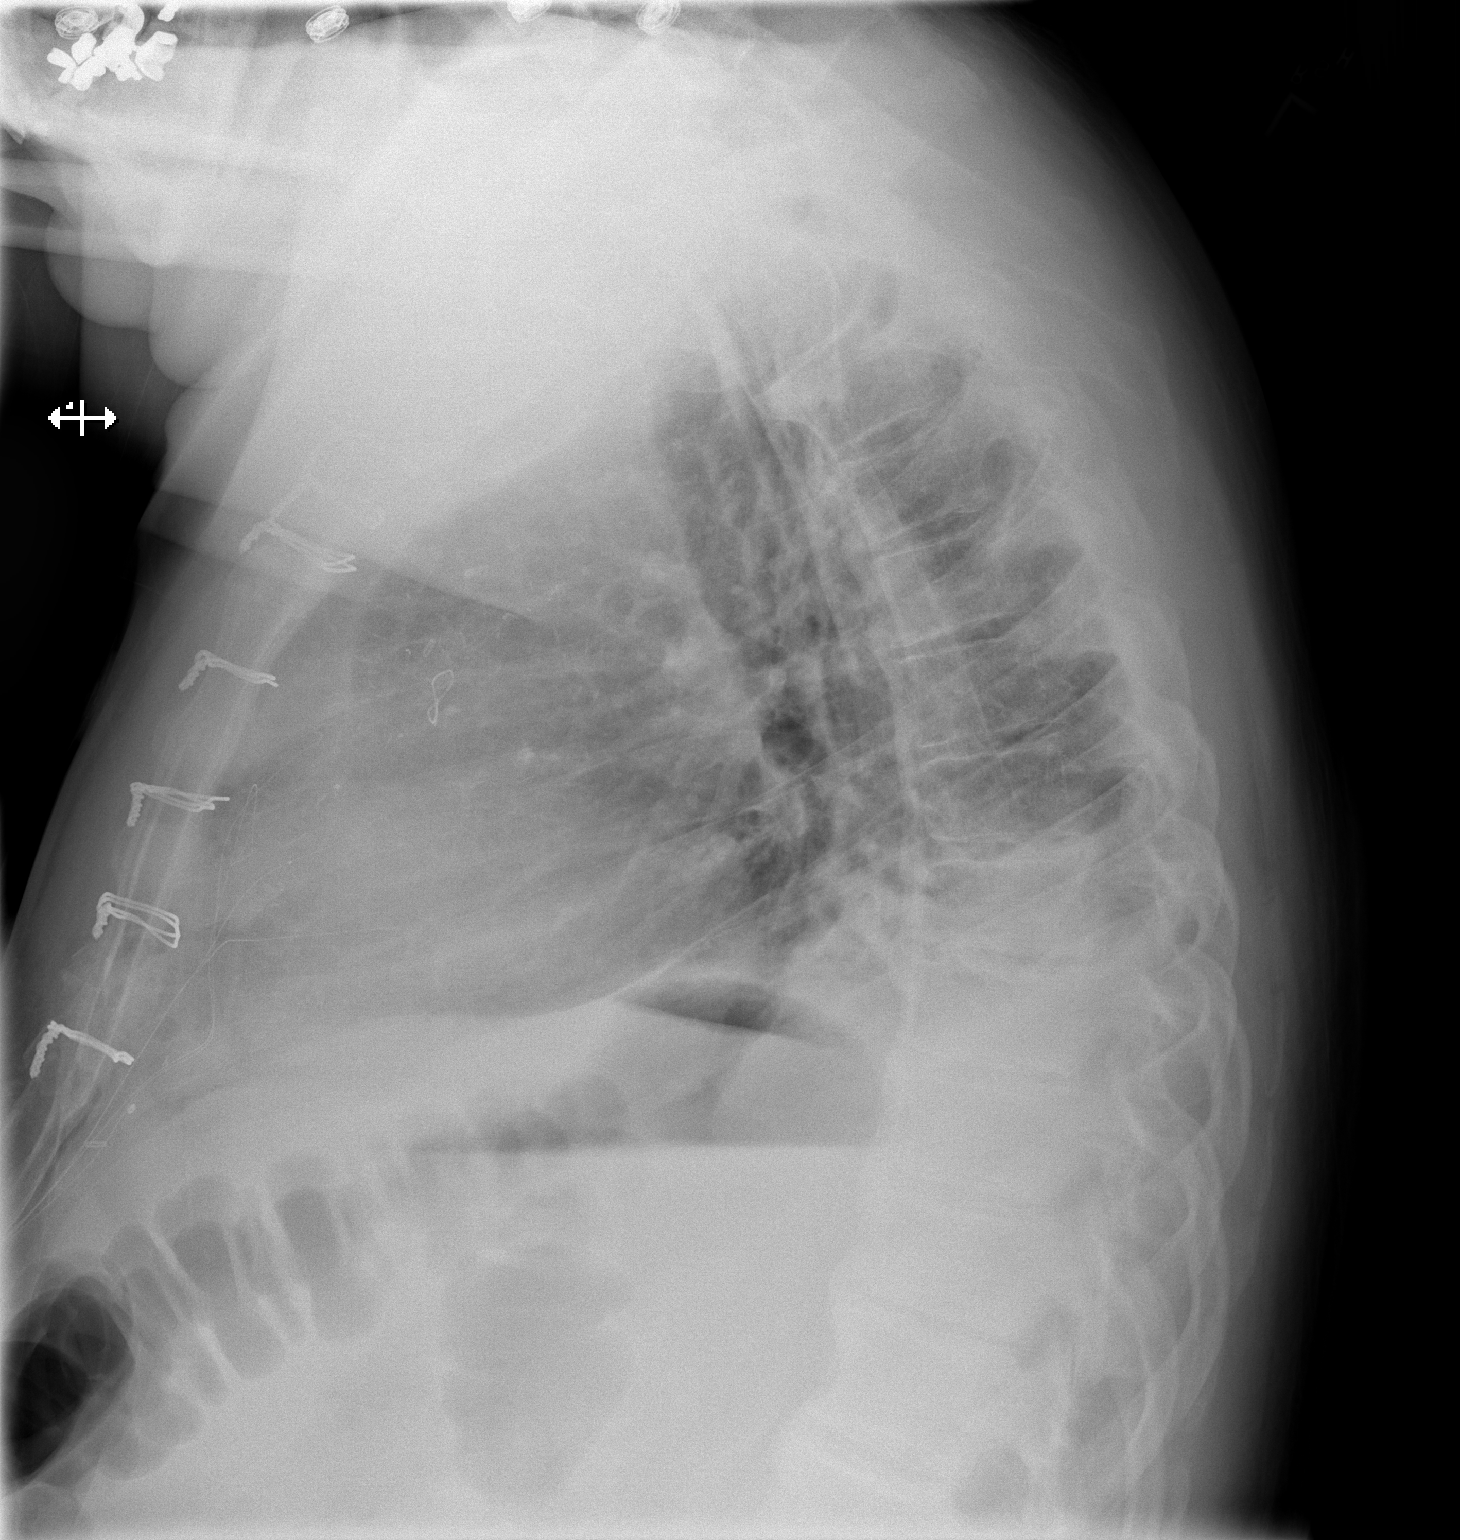

[2 of 2 positions shown; findings below may reference images not displayed]

FINDINGS: Trachea is midline.  Cardiomediastinal silhouette is
stable in appearance.  Right IJ catheter or catheter sheath has
been removed.  Lungs are somewhat low in volume with bibasilar air
space disease and small bilateral pleural effusions, left greater
than right.  Flowing anterior osteophytosis in the thoracic spine.
IMPRESSION: Low lung volumes with bibasilar air space disease and small
bilateral pleural effusions, left greater than right.

## 2014-07-17 ENCOUNTER — Encounter (HOSPITAL_COMMUNITY): Payer: Self-pay | Admitting: Cardiology

## 2014-07-29 ENCOUNTER — Ambulatory Visit (INDEPENDENT_AMBULATORY_CARE_PROVIDER_SITE_OTHER): Payer: BC Managed Care – PPO | Admitting: Cardiology

## 2014-07-29 ENCOUNTER — Encounter: Payer: Self-pay | Admitting: Cardiology

## 2014-07-29 DIAGNOSIS — I10 Essential (primary) hypertension: Secondary | ICD-10-CM

## 2014-07-29 DIAGNOSIS — I2510 Atherosclerotic heart disease of native coronary artery without angina pectoris: Secondary | ICD-10-CM

## 2014-07-29 DIAGNOSIS — E119 Type 2 diabetes mellitus without complications: Secondary | ICD-10-CM

## 2014-07-29 MED ORDER — NEBIVOLOL HCL 5 MG PO TABS: 5.0000 mg | ORAL_TABLET | Freq: Every day | ORAL | Status: AC

## 2014-07-29 NOTE — Assessment & Plan Note (Signed)
Repeat B/P 142/82. Bystolic recently increased by his PCP

## 2014-07-29 NOTE — Assessment & Plan Note (Signed)
He has been intolerant to Lipitor and Crestor with myalgias.

## 2014-07-29 NOTE — Assessment & Plan Note (Signed)
Followed by PCP

## 2014-07-29 NOTE — Assessment & Plan Note (Signed)
No angina 

## 2014-07-29 NOTE — Progress Notes (Signed)
07/29/2014 Gregory Reese   Jul 29, 1956  [id number]  Primary Physician NIFONG,TED, MD Primary Cardiologist: Dr Herbie BaltimoreHarding  HPI:  58 y/o followed by Dr Herbie BaltimoreHarding (former pt of Dr Clarene DukeLittle). He has CAD and had CABG x 3, all arterial grafts, Aug 2013. He had PAF post op but has had no recurrence. He has NIDDM, HTN, and dyslipidemia. His PCP has recently increased his Bystolic secondary to elevated B/P. Gregory HuaDavid has had some neck pain and come i n today for a "cjeck up". He was concerned his neck pain may me cardiac related. He denies chest pain.    Current Outpatient Prescriptions  Medication Sig Dispense Refill  . amLODipine (NORVASC) 5 MG tablet Take 1 tablet by mouth daily.  0  . aspirin 81 MG tablet Take 160 mg by mouth daily.    Marland Kitchen. linagliptin (TRADJENTA) 5 MG TABS tablet Take 5 mg by mouth daily.    Marland Kitchen. lisinopril (PRINIVIL,ZESTRIL) 40 MG tablet Take 40 mg by mouth daily.    . nebivolol (BYSTOLIC) 5 MG tablet Take 5 mg by mouth daily.    . niacin (NIASPAN) 500 MG CR tablet Take 1 tablet (500 mg total) by mouth at bedtime. 90 tablet 3  . Omega-3 Fatty Acids (FISH OIL) 1000 MG CAPS Take 2 capsules by mouth 2 (two) times daily.    Marland Kitchen. omeprazole (PRILOSEC OTC) 20 MG tablet TAKE 1/2 TABLET A DAY    . metFORMIN (GLUCOPHAGE) 850 MG tablet Take 1 tablet (850 mg total) by mouth 2 (two) times daily with a meal. 60 tablet 1  . nebivolol (BYSTOLIC) 5 MG tablet Take 1 tablet (5 mg total) by mouth daily. 28 tablet 0   No current facility-administered medications for this visit.    Allergies  Allergen Reactions  . Statins Other (See Comments)    Myagias  . Amoclan [Amoxicillin-Pot Clavulanate]   . Penicillins Other (See Comments)    "I get dizzy and pass out; pretty close to it" "ALL THE CILLINS"    History   Social History  . Marital Status: Married    Spouse Name: N/A    Number of Children: N/A  . Years of Education: N/A   Occupational History  . Not on file.   Social History Main Topics    . Smoking status: Former Smoker -- 20 years    Types: Cigars    Quit date: 10/06/2009  . Smokeless tobacco: Current User    Types: Chew     Comment: 04/04/2012 offered cessation materials; pt declines  . Alcohol Use: No  . Drug Use: No  . Sexual Activity: Not Currently   Other Topics Concern  . Not on file   Social History Narrative    He is a married father of 2. He apparently chews tobacco. Does not drink alcohol.  He has been walking 30 minutes 6 to 7 days a week, walking about a mile to a mile and a half. He quit smoking cigars about 2+ years ago. 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 Julieanne MansonAlfred Little.     Review of Systems: General: negative for chills, fever, night sweats or weight changes.  Cardiovascular: negative for chest pain, dyspnea on exertion, edema, orthopnea, palpitations, paroxysmal nocturnal dyspnea or shortness of breath Dermatological: negative for rash Respiratory: negative for cough or wheezing Urologic: negative for hematuria Abdominal: negative for nausea, vomiting, diarrhea, bright red blood per rectum, melena, or hematemesis Neurologic: negative for visual changes, syncope, or dizziness All  other systems reviewed and are otherwise negative except as noted above.    Blood pressure 164/90, pulse 65, height 6\' 1"  (1.854 m), weight 253 lb 9.6 oz (115.032 kg).  General appearance: alert, cooperative, no distress and moderately obese Neck: no carotid bruit and no JVD Lungs: clear to auscultation bilaterally Heart: regular rate and rhythm  EKG NSR  ASSESSMENT AND PLAN:   CAD, CABG X 3  04/06/2012 No angina  Diabetes mellitus type II, non insulin dependent Followed by PCP  Hypertension Repeat B/P 142/82. Bystolic recently increased by his PCP  Dyslipidemia He has been intolerant to Lipitor and Crestor with myalgias.   PLAN  His neck pain sounds M/S. We discussed statin therapy. He is intolerant to stains and has tried Lipitor  and Crestor. I'll have him see Dr Herbie BaltimoreHarding after the first of the year to see if he might be a candidate for a PCSK9 inhibitor. Repeat B/P by me was 142/82  [facility] CenterKILROY,Teah Votaw KPA-C 07/29/2014 9:49 AM

## 2014-07-29 NOTE — Patient Instructions (Signed)
Your physician recommends that you schedule a follow-up appointment in: 3 months with Dr. Harding. No changes were made today in your therapy. 

## 2014-07-30 NOTE — Addendum Note (Signed)
Addended byMarella Bile: Adalyne Lovick W. on: 07/30/2014 02:42 PM   Modules accepted: Orders

## 2014-10-23 ENCOUNTER — Ambulatory Visit: Payer: BC Managed Care – PPO | Admitting: Cardiology
